# Patient Record
Sex: Male | Born: 1940 | Race: White | Hispanic: No | Marital: Married | Smoking: Former smoker
Health system: Southern US, Community
[De-identification: ages and names within clinical notes are randomized; demographics above are authoritative.]

## PROBLEM LIST (undated history)

## (undated) DIAGNOSIS — I4892 Unspecified atrial flutter: Secondary | ICD-10-CM

## (undated) DIAGNOSIS — E785 Hyperlipidemia, unspecified: Secondary | ICD-10-CM

## (undated) DIAGNOSIS — M1990 Unspecified osteoarthritis, unspecified site: Secondary | ICD-10-CM

## (undated) DIAGNOSIS — L409 Psoriasis, unspecified: Secondary | ICD-10-CM

## (undated) DIAGNOSIS — E119 Type 2 diabetes mellitus without complications: Secondary | ICD-10-CM

## (undated) DIAGNOSIS — I499 Cardiac arrhythmia, unspecified: Secondary | ICD-10-CM

## (undated) DIAGNOSIS — K4090 Unilateral inguinal hernia, without obstruction or gangrene, not specified as recurrent: Secondary | ICD-10-CM

## (undated) HISTORY — DX: Hyperlipidemia, unspecified: E78.5

## (undated) HISTORY — DX: Type 2 diabetes mellitus without complications: E11.9

## (undated) HISTORY — DX: Unspecified osteoarthritis, unspecified site: M19.90

## (undated) HISTORY — PX: APPENDECTOMY: SHX54

## (undated) HISTORY — PX: HERNIA REPAIR: SHX51

## (undated) HISTORY — DX: Unspecified atrial flutter: I48.92

---

## 2003-07-12 ENCOUNTER — Ambulatory Visit (HOSPITAL_COMMUNITY): Admission: RE | Admit: 2003-07-12 | Discharge: 2003-07-12 | Payer: Self-pay | Admitting: Internal Medicine

## 2004-01-10 ENCOUNTER — Inpatient Hospital Stay (HOSPITAL_COMMUNITY): Admission: RE | Admit: 2004-01-10 | Discharge: 2004-01-11 | Payer: Self-pay | Admitting: Internal Medicine

## 2005-10-22 HISTORY — PX: ABLATION: SHX5711

## 2006-02-06 ENCOUNTER — Ambulatory Visit: Payer: Self-pay | Admitting: Internal Medicine

## 2006-02-06 ENCOUNTER — Ambulatory Visit (HOSPITAL_COMMUNITY): Admission: RE | Admit: 2006-02-06 | Discharge: 2006-02-06 | Payer: Self-pay | Admitting: Internal Medicine

## 2006-02-12 ENCOUNTER — Ambulatory Visit (HOSPITAL_COMMUNITY): Admission: RE | Admit: 2006-02-12 | Discharge: 2006-02-12 | Payer: Self-pay | Admitting: Internal Medicine

## 2006-02-28 ENCOUNTER — Emergency Department (HOSPITAL_COMMUNITY): Admission: EM | Admit: 2006-02-28 | Discharge: 2006-02-28 | Payer: Self-pay | Admitting: Emergency Medicine

## 2006-03-28 ENCOUNTER — Inpatient Hospital Stay (HOSPITAL_COMMUNITY): Admission: AD | Admit: 2006-03-28 | Discharge: 2006-03-29 | Payer: Self-pay | Admitting: Internal Medicine

## 2006-03-29 ENCOUNTER — Ambulatory Visit: Payer: Self-pay | Admitting: *Deleted

## 2006-04-02 ENCOUNTER — Encounter (HOSPITAL_COMMUNITY): Admission: RE | Admit: 2006-04-02 | Discharge: 2006-05-02 | Payer: Self-pay | Admitting: *Deleted

## 2006-04-02 ENCOUNTER — Ambulatory Visit: Payer: Self-pay | Admitting: *Deleted

## 2006-04-04 ENCOUNTER — Ambulatory Visit: Payer: Self-pay | Admitting: *Deleted

## 2006-04-11 ENCOUNTER — Ambulatory Visit: Payer: Self-pay | Admitting: *Deleted

## 2006-04-11 ENCOUNTER — Inpatient Hospital Stay (HOSPITAL_BASED_OUTPATIENT_CLINIC_OR_DEPARTMENT_OTHER): Admission: RE | Admit: 2006-04-11 | Discharge: 2006-04-11 | Payer: Self-pay | Admitting: Internal Medicine

## 2006-04-15 ENCOUNTER — Ambulatory Visit: Payer: Self-pay | Admitting: *Deleted

## 2006-04-25 ENCOUNTER — Ambulatory Visit: Payer: Self-pay | Admitting: Cardiology

## 2006-04-30 ENCOUNTER — Ambulatory Visit: Payer: Self-pay | Admitting: Cardiology

## 2006-05-02 ENCOUNTER — Ambulatory Visit: Payer: Self-pay | Admitting: Internal Medicine

## 2006-05-02 ENCOUNTER — Inpatient Hospital Stay (HOSPITAL_COMMUNITY): Admission: RE | Admit: 2006-05-02 | Discharge: 2006-05-03 | Payer: Self-pay | Admitting: Internal Medicine

## 2006-05-06 ENCOUNTER — Ambulatory Visit: Payer: Self-pay | Admitting: Cardiology

## 2006-05-15 ENCOUNTER — Ambulatory Visit: Payer: Self-pay | Admitting: *Deleted

## 2006-05-15 ENCOUNTER — Encounter (INDEPENDENT_AMBULATORY_CARE_PROVIDER_SITE_OTHER): Payer: Self-pay | Admitting: *Deleted

## 2006-05-28 ENCOUNTER — Ambulatory Visit: Payer: Self-pay | Admitting: *Deleted

## 2006-06-13 ENCOUNTER — Ambulatory Visit: Payer: Self-pay | Admitting: Internal Medicine

## 2006-06-13 ENCOUNTER — Ambulatory Visit: Payer: Self-pay | Admitting: Cardiology

## 2012-05-22 DEATH — deceased

## 2013-05-21 ENCOUNTER — Encounter (INDEPENDENT_AMBULATORY_CARE_PROVIDER_SITE_OTHER): Payer: Self-pay | Admitting: *Deleted

## 2013-12-24 ENCOUNTER — Telehealth (INDEPENDENT_AMBULATORY_CARE_PROVIDER_SITE_OTHER): Payer: Self-pay | Admitting: *Deleted

## 2013-12-24 ENCOUNTER — Encounter (INDEPENDENT_AMBULATORY_CARE_PROVIDER_SITE_OTHER): Payer: Self-pay

## 2013-12-24 ENCOUNTER — Other Ambulatory Visit (INDEPENDENT_AMBULATORY_CARE_PROVIDER_SITE_OTHER): Payer: Self-pay | Admitting: *Deleted

## 2013-12-24 DIAGNOSIS — Z1211 Encounter for screening for malignant neoplasm of colon: Secondary | ICD-10-CM

## 2013-12-24 NOTE — Telephone Encounter (Signed)
Patient needs movi prep 

## 2013-12-25 MED ORDER — PEG-KCL-NACL-NASULF-NA ASC-C 100 G PO SOLR: 1.0000 | Freq: Once | ORAL | Status: DC

## 2014-01-04 ENCOUNTER — Encounter (INDEPENDENT_AMBULATORY_CARE_PROVIDER_SITE_OTHER): Payer: Self-pay | Admitting: *Deleted

## 2014-01-19 ENCOUNTER — Telehealth (INDEPENDENT_AMBULATORY_CARE_PROVIDER_SITE_OTHER): Payer: Self-pay | Admitting: *Deleted

## 2014-01-19 NOTE — Telephone Encounter (Signed)
agree

## 2014-01-19 NOTE — Telephone Encounter (Signed)
  Procedure: tcs  Reason/Indication:  screening  Has patient had this procedure before?  yes, 2004 -- scanned  If so, when, by whom and where?    Is there a family history of colon cancer?  no  Who?  What age when diagnosed?    Is patient diabetic?   no      Does patient have prosthetic heart valve?  no  Do you have a pacemaker?  no  Has patient ever had endocarditis? no  Has patient had joint replacement within last 12 months?  no  Does patient tend to be constipated or take laxatives? no  Is patient on Coumadin, Plavix and/or Aspirin? no  Medications: humira injection every 2 weeks  Allergies: nkda  Medication Adjustment:   Procedure date & time: 02/17/14

## 2014-02-04 ENCOUNTER — Encounter (HOSPITAL_COMMUNITY): Payer: Self-pay | Admitting: Pharmacy Technician

## 2014-02-17 ENCOUNTER — Encounter (HOSPITAL_COMMUNITY): Payer: Self-pay | Admitting: *Deleted

## 2014-02-17 ENCOUNTER — Ambulatory Visit (HOSPITAL_COMMUNITY)
Admission: RE | Admit: 2014-02-17 | Discharge: 2014-02-17 | Disposition: A | Discharge status: Home / Self Care | Payer: Medicare PPO | Source: Ambulatory Visit | Attending: Internal Medicine | Admitting: Internal Medicine

## 2014-02-17 ENCOUNTER — Encounter (HOSPITAL_COMMUNITY)
Admission: RE | Disposition: A | Discharge status: Home / Self Care | Payer: Self-pay | Source: Ambulatory Visit | Attending: Internal Medicine

## 2014-02-17 DIAGNOSIS — Q438 Other specified congenital malformations of intestine: Secondary | ICD-10-CM | POA: Insufficient documentation

## 2014-02-17 DIAGNOSIS — K644 Residual hemorrhoidal skin tags: Secondary | ICD-10-CM | POA: Insufficient documentation

## 2014-02-17 DIAGNOSIS — K6389 Other specified diseases of intestine: Secondary | ICD-10-CM | POA: Insufficient documentation

## 2014-02-17 DIAGNOSIS — Z79899 Other long term (current) drug therapy: Secondary | ICD-10-CM | POA: Insufficient documentation

## 2014-02-17 DIAGNOSIS — Z1211 Encounter for screening for malignant neoplasm of colon: Secondary | ICD-10-CM | POA: Insufficient documentation

## 2014-02-17 DIAGNOSIS — K5730 Diverticulosis of large intestine without perforation or abscess without bleeding: Secondary | ICD-10-CM | POA: Insufficient documentation

## 2014-02-17 DIAGNOSIS — R197 Diarrhea, unspecified: Secondary | ICD-10-CM

## 2014-02-17 HISTORY — DX: Psoriasis, unspecified: L40.9

## 2014-02-17 HISTORY — PX: COLONOSCOPY: SHX5424

## 2014-02-17 SURGERY — COLONOSCOPY
Anesthesia: Moderate Sedation

## 2014-02-17 MED ORDER — MEPERIDINE HCL 50 MG/ML IJ SOLN
INTRAMUSCULAR | Status: DC | PRN
  Administered 2014-02-17 (×2): 25 mg via INTRAVENOUS

## 2014-02-17 MED ORDER — MIDAZOLAM HCL 5 MG/5ML IJ SOLN
INTRAMUSCULAR | Status: AC
  Filled 2014-02-17: qty 10

## 2014-02-17 MED ORDER — MIDAZOLAM HCL 5 MG/5ML IJ SOLN
INTRAMUSCULAR | Status: DC | PRN
  Administered 2014-02-17 (×2): 2 mg via INTRAVENOUS
  Administered 2014-02-17: 1 mg via INTRAVENOUS

## 2014-02-17 MED ORDER — SODIUM CHLORIDE 0.9 % IV SOLN
INTRAVENOUS | Status: DC
  Administered 2014-02-17: 1000 mL via INTRAVENOUS

## 2014-02-17 MED ORDER — SIMETHICONE 40 MG/0.6ML PO SUSP
ORAL | Status: DC | PRN
  Administered 2014-02-17: 12:00:00

## 2014-02-17 MED ORDER — MEPERIDINE HCL 50 MG/ML IJ SOLN
INTRAMUSCULAR | Status: AC
  Filled 2014-02-17: qty 1

## 2014-02-17 NOTE — Op Note (Signed)
COLONOSCOPY PROCEDURE REPORT  PATIENT:  Roger KyleRichard M Schrieber  MR#:  [id number] Birthdate:  1940/12/22, 73 y.o., male Endoscopist:  Dr. Malissa HippoNajeeb U. Rehman, MD Referred By:  Dr. Carylon Perchesoy Fagan, MD  Procedure Date: 02/17/2014  Procedure:   Colonoscopy  Indications:  Patient is 73 year old Caucasian male who is here for screening colonoscopy. His last exam was in 2004. He states he has chronic diarrhea but 1-2 loose stools a day. Family history is negative for CRC or inflammatory bowel disease. He is on Humira for psoriasis.  Informed Consent:  The procedure and risks were reviewed with the patient and informed consent was obtained.  Medications:  Demerol 50 mg IV Versed 5 mg IV  Description of procedure:  After a digital rectal exam was performed, that colonoscope was advanced from the anus through the rectum and colon to the area of the cecum, ileocecal valve and appendiceal orifice. The cecum was deeply intubated. These structures were well-seen and photographed for the record. From the level of the cecum and ileocecal valve, the scope was slowly and cautiously withdrawn. The mucosal surfaces were carefully surveyed utilizing scope tip to flexion to facilitate fold flattening as needed. The scope was pulled down into the rectum where a thorough exam including retroflexion was performed.  Findings:   Prep excellent. Redundant colon with normal appearing mucosa throughout. Scattered diverticula noted at descending and sigmoid colon. Normal rectal mucosa. Small hemorrhoids below the dentate line.   Therapeutic/Diagnostic Maneuvers Performed:  Random biopsies taken for mucosa of sigmoid colon looking for microscopic colitis.  Complications:  None  Cecal Withdrawal Time:  7 minutes  Impression:  Examination performed to cecum. Redundant colon with left-sided diverticulosis. Random biopsies taken from mucosa of sigmoid colon looking for microscopic colitis. Small external  hemorrhoids.  Recommendations:  Standard instructions given. I will contact patient with biopsy results and further recommendations.  Malissa Hippoajeeb U Rehman  02/17/2014 12:42 PM  CC: Dr. Carylon PerchesFAGAN,ROY, MD & Dr. Bonnetta BarryNo ref. provider found

## 2014-02-17 NOTE — H&P (Signed)
Roger George is an 73 y.o. male.   Chief Complaint: Patient is here for colonoscopy. HPI: Patient is 73 year old Caucasian male who is here for screening colonoscopy. His last exam was in 2004. He denies abdominal pain or rectal bleeding. He always has loose stools usually 1-2 per day. 35 years ago he was told by his physician that he had parasite and he would have lifelong diarrhea. He has good appetite and his weight has been stable. He is on Humira for psoriasis. Eminase is negative for CRC or inflammatory bowel disease.  Past Medical History  Diagnosis Date  . Psoriasis     Past Surgical History  Procedure Laterality Date  . Ablation      heart  . Appendectomy    . Hernia repair Right     History reviewed. No pertinent family history. Social History:  reports that he has never smoked. He does not have any smokeless tobacco history on file. His alcohol and drug histories are not on file.  Allergies: No Known Allergies  Medications Prior to Admission  Medication Sig Dispense Refill  . Adalimumab (HUMIRA) 20 MG/0.4ML KIT Inject 20 mg into the skin every 14 (fourteen) days.      . peg 3350 powder (MOVIPREP) 100 G SOLR Take 1 kit (200 g total) by mouth once.  1 kit  0    No results found for this or any previous visit (from the past 48 hour(s)). No results found.  ROS  Blood pressure 123/91, pulse 75, temperature 97.7 F (36.5 C), temperature source Oral, resp. rate 19, height 5' 11"  (1.803 m), weight 227 lb (102.967 kg), SpO2 92.00%. Physical Exam  Constitutional: He appears well-developed and well-nourished.  HENT:  Mouth/Throat: Oropharynx is clear and moist.  Eyes: Conjunctivae are normal. No scleral icterus.  Neck: No thyromegaly present.  Cardiovascular: Normal rate, regular rhythm and normal heart sounds.   No murmur heard. Respiratory: Effort normal and breath sounds normal.  GI: Soft. He exhibits no distension and no mass. There is no tenderness.   Musculoskeletal: He exhibits no edema.  Lymphadenopathy:    He has no cervical adenopathy.  Neurological: He is alert.  Skin: Skin is warm and dry.  Erythematous rash at left leg     Assessment/Plan Average risk screening colonoscopy. Chronic diarrhea.  Shane Badeaux U Jermya Dowding 02/17/2014, 12:07 PM

## 2014-02-17 NOTE — Discharge Instructions (Signed)
Resume usual medications and high fiber diet. °No driving for 24 hours. °Physician will call with biopsy results ° ° °High-Fiber Diet °Fiber is found in fruits, vegetables, and grains. A high-fiber diet encourages the addition of more whole grains, legumes, fruits, and vegetables in your diet. The recommended amount of fiber for adult males is 38 g per day. For adult females, it is 25 g per day. Pregnant and lactating women should get 28 g of fiber per day. If you have a digestive or bowel problem, ask your caregiver for advice before adding high-fiber foods to your diet. Eat a variety of high-fiber foods instead of only a select few type of foods.  °PURPOSE °· To increase stool bulk. °· To make bowel movements more regular to prevent constipation. °· To lower cholesterol. °· To prevent overeating. °WHEN IS THIS DIET USED? °· It may be used if you have constipation and hemorrhoids. °· It may be used if you have uncomplicated diverticulosis (intestine condition) and irritable bowel syndrome. °· It may be used if you need help with weight management. °· It may be used if you want to add it to your diet as a protective measure against atherosclerosis, diabetes, and cancer. °SOURCES OF FIBER °· Whole-grain breads and cereals. °· Fruits, such as apples, oranges, bananas, berries, prunes, and pears. °· Vegetables, such as green peas, carrots, sweet potatoes, beets, broccoli, cabbage, spinach, and artichokes. °· Legumes, such split peas, soy, lentils. °· Almonds. °FIBER CONTENT IN FOODS °Starches and Grains / Dietary Fiber (g) °· Cheerios, 1 cup / 3 g °· Corn Flakes cereal, 1 cup / 0.7 g °· Rice crispy treat cereal, 1¼ cup / 0.3 g °· Instant oatmeal (cooked), ½ cup / 2 g °· Frosted wheat cereal, 1 cup / 5.1 g °· Brown, long-grain rice (cooked), 1 cup / 3.5 g °· White, long-grain rice (cooked), 1 cup / 0.6 g °· Enriched macaroni (cooked), 1 cup / 2.5 g °Legumes / Dietary Fiber (g) °· Baked beans (canned, plain, or  vegetarian), ½ cup / 5.2 g °· Kidney beans (canned), ½ cup / 6.8 g °· Pinto beans (cooked), ½ cup / 5.5 g °Breads and Crackers / Dietary Fiber (g) °· Plain or honey graham crackers, 2 squares / 0.7 g °· Saltine crackers, 3 squares / 0.3 g °· Plain, salted pretzels, 10 pieces / 1.8 g °· Whole-wheat bread, 1 slice / 1.9 g °· White bread, 1 slice / 0.7 g °· Raisin bread, 1 slice / 1.2 g °· Plain bagel, 3 oz / 2 g °· Flour tortilla, 1 oz / 0.9 g °· Corn tortilla, 1 small / 1.5 g °· Hamburger or hotdog bun, 1 small / 0.9 g °Fruits / Dietary Fiber (g) °· Apple with skin, 1 medium / 4.4 g °· Sweetened applesauce, ½ cup / 1.5 g °· Banana, ½ medium / 1.5 g °· Grapes, 10 grapes / 0.4 g °· Orange, 1 small / 2.3 g °· Raisin, 1.5 oz / 1.6 g °· Melon, 1 cup / 1.4 g °Vegetables / Dietary Fiber (g) °· Green beans (canned), ½ cup / 1.3 g °· Carrots (cooked), ½ cup / 2.3 g °· Broccoli (cooked), ½ cup / 2.8 g °· Peas (cooked), ½ cup / 4.4 g °· Mashed potatoes, ½ cup / 1.6 g °· Lettuce, 1 cup / 0.5 g °· Corn (canned), ½ cup / 1.6 g °· Tomato, ½ cup / 1.1 g °Document Released: 10/08/2005 Document Revised: 04/08/2012 Document Reviewed: 01/10/2012 °ExitCare® Patient   Information 2014 [employer] [place]. Colonoscopy Care After These instructions give you information on caring for yourself after your procedure. Your doctor may also give you more specific instructions. Call your doctor if you have any problems or questions after your procedure. HOME CARE  Take it easy for the next 24 hours.  Rest.  Walk or use warm packs on your belly (abdomen) if you have belly cramping or gas.  Do not drive for 24 hours.  You may shower.  Do not sign important papers or use machinery for 24 hours.  Drink enough fluids to keep your pee (urine) clear or pale yellow.  Resume your normal diet. Avoid heavy or fried foods.  Avoid alcohol.  Continue taking your normal medicines.  Only take medicine as told by your doctor. Do not take  aspirin. If you had growths (polyps) removed:  Do not take aspirin.  Do not drink alcohol for 7 days or as told by your doctor.  Eat a soft diet for 24 hours. GET HELP RIGHT AWAY IF:  You have a fever.  You pass clumps of tissue (blood clots) or fill the toilet with blood.  You have belly pain that gets worse and medicine does not help.  Your belly is puffy (swollen).  You feel sick to your stomach (nauseous) or throw up (vomit). MAKE SURE YOU:  Understand these instructions.  Will watch your condition.  Will get help right away if you are not doing well or get worse. Document Released: 11/10/2010 Document Revised: 12/31/2011 Document Reviewed: 06/15/2013 [facility] Patient Information 2014 [employer], [place].

## 2014-02-19 ENCOUNTER — Encounter (HOSPITAL_COMMUNITY): Payer: Self-pay | Admitting: Internal Medicine

## 2014-02-23 ENCOUNTER — Encounter (INDEPENDENT_AMBULATORY_CARE_PROVIDER_SITE_OTHER): Payer: Self-pay | Admitting: *Deleted

## 2014-10-22 DIAGNOSIS — K4090 Unilateral inguinal hernia, without obstruction or gangrene, not specified as recurrent: Secondary | ICD-10-CM

## 2014-10-22 HISTORY — DX: Unilateral inguinal hernia, without obstruction or gangrene, not specified as recurrent: K40.90

## 2014-12-29 DIAGNOSIS — M71071 Abscess of bursa, right ankle and foot: Secondary | ICD-10-CM | POA: Diagnosis not present

## 2014-12-31 DIAGNOSIS — M71071 Abscess of bursa, right ankle and foot: Secondary | ICD-10-CM | POA: Diagnosis not present

## 2015-01-03 DIAGNOSIS — M71071 Abscess of bursa, right ankle and foot: Secondary | ICD-10-CM | POA: Diagnosis not present

## 2015-02-28 ENCOUNTER — Encounter (HOSPITAL_BASED_OUTPATIENT_CLINIC_OR_DEPARTMENT_OTHER): Payer: Medicare PPO | Attending: Plastic Surgery

## 2015-02-28 DIAGNOSIS — Z8249 Family history of ischemic heart disease and other diseases of the circulatory system: Secondary | ICD-10-CM | POA: Diagnosis not present

## 2015-02-28 DIAGNOSIS — Z87891 Personal history of nicotine dependence: Secondary | ICD-10-CM | POA: Insufficient documentation

## 2015-02-28 DIAGNOSIS — L97811 Non-pressure chronic ulcer of other part of right lower leg limited to breakdown of skin: Secondary | ICD-10-CM | POA: Insufficient documentation

## 2015-02-28 DIAGNOSIS — L97812 Non-pressure chronic ulcer of other part of right lower leg with fat layer exposed: Secondary | ICD-10-CM | POA: Diagnosis not present

## 2015-02-28 DIAGNOSIS — K4090 Unilateral inguinal hernia, without obstruction or gangrene, not specified as recurrent: Secondary | ICD-10-CM | POA: Diagnosis not present

## 2015-02-28 DIAGNOSIS — M1990 Unspecified osteoarthritis, unspecified site: Secondary | ICD-10-CM | POA: Insufficient documentation

## 2015-02-28 DIAGNOSIS — Z833 Family history of diabetes mellitus: Secondary | ICD-10-CM | POA: Diagnosis not present

## 2015-03-14 DIAGNOSIS — Z8249 Family history of ischemic heart disease and other diseases of the circulatory system: Secondary | ICD-10-CM | POA: Diagnosis not present

## 2015-03-14 DIAGNOSIS — K4090 Unilateral inguinal hernia, without obstruction or gangrene, not specified as recurrent: Secondary | ICD-10-CM | POA: Diagnosis not present

## 2015-03-14 DIAGNOSIS — L97812 Non-pressure chronic ulcer of other part of right lower leg with fat layer exposed: Secondary | ICD-10-CM | POA: Diagnosis not present

## 2015-03-14 DIAGNOSIS — Z833 Family history of diabetes mellitus: Secondary | ICD-10-CM | POA: Diagnosis not present

## 2015-03-14 DIAGNOSIS — M1990 Unspecified osteoarthritis, unspecified site: Secondary | ICD-10-CM | POA: Diagnosis not present

## 2015-03-14 DIAGNOSIS — L97811 Non-pressure chronic ulcer of other part of right lower leg limited to breakdown of skin: Secondary | ICD-10-CM | POA: Diagnosis not present

## 2015-03-14 DIAGNOSIS — Z87891 Personal history of nicotine dependence: Secondary | ICD-10-CM | POA: Diagnosis not present

## 2015-03-28 ENCOUNTER — Encounter (HOSPITAL_BASED_OUTPATIENT_CLINIC_OR_DEPARTMENT_OTHER): Payer: Medicare PPO | Attending: Plastic Surgery

## 2015-03-28 DIAGNOSIS — L97811 Non-pressure chronic ulcer of other part of right lower leg limited to breakdown of skin: Secondary | ICD-10-CM | POA: Diagnosis not present

## 2015-03-29 ENCOUNTER — Other Ambulatory Visit: Payer: Self-pay | Admitting: General Surgery

## 2015-03-29 DIAGNOSIS — S81809A Unspecified open wound, unspecified lower leg, initial encounter: Secondary | ICD-10-CM | POA: Diagnosis not present

## 2015-04-11 DIAGNOSIS — L97811 Non-pressure chronic ulcer of other part of right lower leg limited to breakdown of skin: Secondary | ICD-10-CM | POA: Diagnosis not present

## 2015-04-12 DIAGNOSIS — L409 Psoriasis, unspecified: Secondary | ICD-10-CM | POA: Diagnosis not present

## 2015-04-18 DIAGNOSIS — L97811 Non-pressure chronic ulcer of other part of right lower leg limited to breakdown of skin: Secondary | ICD-10-CM | POA: Diagnosis not present

## 2015-04-18 DIAGNOSIS — S81809A Unspecified open wound, unspecified lower leg, initial encounter: Secondary | ICD-10-CM | POA: Diagnosis not present

## 2015-04-18 NOTE — Pre-Procedure Instructions (Signed)
    Roger George  04/18/2015     Your procedure is scheduled on  Wednesday, July 6   [facility]                Your surgery is scheduled for 8:30 am                  Call this number if you have problems the morning of surgery: [phone]              For any other questions, please call [phone], Monday - Friday 8 AM - 4 PM.     Remember:  Do not eat food or drink liquids after midnight Tuesday, July 5  Take these medicines the morning of surgery with A SIP OF WATER -   Do not wear jewelry, make-up or nail polish.  Do not wear lotions, powders, or perfumes.               Men may shave face and neck.  Do not bring valuables to the hospital.  [facility] for any belongings or valuables.  Contacts, dentures or bridgework may not be worn into surgery.  Leave your suitcase in the car.  After surgery it may be brought to your room.  For patients admitted to the hospital, discharge time will be determined by your treatment team.  Patients discharged the day of surgery will not be allowed to drive home.   Name and phone number of your driver:   -  Special instructions:  Review  [facility] - Preparing For Surgery.  Please read over the following fact sheets that you were given. Pain Booklet, Coughing and Deep Breathing and Surgical Site Infection Prevention

## 2015-04-19 ENCOUNTER — Encounter (HOSPITAL_COMMUNITY): Payer: Self-pay

## 2015-04-19 ENCOUNTER — Encounter (HOSPITAL_COMMUNITY)
Admission: RE | Admit: 2015-04-19 | Discharge: 2015-04-19 | Disposition: A | Discharge status: Home / Self Care | Payer: Medicare PPO | Source: Ambulatory Visit | Attending: General Surgery | Admitting: General Surgery

## 2015-04-19 DIAGNOSIS — Z01812 Encounter for preprocedural laboratory examination: Secondary | ICD-10-CM | POA: Insufficient documentation

## 2015-04-19 DIAGNOSIS — I491 Atrial premature depolarization: Secondary | ICD-10-CM | POA: Insufficient documentation

## 2015-04-19 DIAGNOSIS — K4090 Unilateral inguinal hernia, without obstruction or gangrene, not specified as recurrent: Secondary | ICD-10-CM | POA: Insufficient documentation

## 2015-04-19 HISTORY — DX: Unilateral inguinal hernia, without obstruction or gangrene, not specified as recurrent: K40.90

## 2015-04-19 HISTORY — DX: Cardiac arrhythmia, unspecified: I49.9

## 2015-04-19 LAB — CBC WITH DIFFERENTIAL/PLATELET
Basophils Absolute: 0 10*3/uL (ref 0.0–0.1)
Basophils Relative: 1 % (ref 0–1)
Eosinophils Absolute: 0.2 10*3/uL (ref 0.0–0.7)
Eosinophils Relative: 3 % (ref 0–5)
HEMATOCRIT: 42.2 % (ref 39.0–52.0)
Hemoglobin: 14.4 g/dL (ref 13.0–17.0)
LYMPHS ABS: 1.6 10*3/uL (ref 0.7–4.0)
Lymphocytes Relative: 28 % (ref 12–46)
MCH: 30.6 pg (ref 26.0–34.0)
MCHC: 34.1 g/dL (ref 30.0–36.0)
MCV: 89.8 fL (ref 78.0–100.0)
Monocytes Absolute: 0.7 10*3/uL (ref 0.1–1.0)
Monocytes Relative: 13 % — ABNORMAL HIGH (ref 3–12)
Neutro Abs: 3.2 10*3/uL (ref 1.7–7.7)
Neutrophils Relative %: 55 % (ref 43–77)
Platelets: 155 10*3/uL (ref 150–400)
RBC: 4.7 MIL/uL (ref 4.22–5.81)
RDW: 13.7 % (ref 11.5–15.5)
WBC: 5.7 10*3/uL (ref 4.0–10.5)

## 2015-04-19 LAB — URINALYSIS, ROUTINE W REFLEX MICROSCOPIC
Bilirubin Urine: NEGATIVE
Glucose, UA: NEGATIVE mg/dL
Hgb urine dipstick: NEGATIVE
KETONES UR: NEGATIVE mg/dL
LEUKOCYTES UA: NEGATIVE
NITRITE: NEGATIVE
PROTEIN: NEGATIVE mg/dL
Specific Gravity, Urine: 1.016 (ref 1.005–1.030)
Urobilinogen, UA: 0.2 mg/dL (ref 0.0–1.0)
pH: 5 (ref 5.0–8.0)

## 2015-04-19 LAB — BASIC METABOLIC PANEL
Anion gap: 7 (ref 5–15)
BUN: 12 mg/dL (ref 6–20)
CALCIUM: 9.3 mg/dL (ref 8.9–10.3)
CO2: 27 mmol/L (ref 22–32)
CREATININE: 0.95 mg/dL (ref 0.61–1.24)
Chloride: 104 mmol/L (ref 101–111)
GFR calc Af Amer: >60 mL/min (ref >60)
Glucose, Bld: 151 mg/dL — ABNORMAL HIGH (ref 65–99)
POTASSIUM: 3.9 mmol/L (ref 3.5–5.1)
Sodium: 138 mmol/L (ref 135–145)

## 2015-04-19 NOTE — Progress Notes (Signed)
   04/19/15 0905  OBSTRUCTIVE SLEEP APNEA  Have you ever been diagnosed with sleep apnea through a sleep study? No  Do you snore loudly (loud enough to be heard through closed doors)?  0  Do you often feel tired, fatigued, or sleepy during the daytime? 0  Has anyone observed you stop breathing during your sleep? 0  Do you have, or are you being treated for high blood pressure? 0  BMI more than 35 kg/m2? 0  Age over 75 years old? 1  Neck circumference greater than 40 cm/16 inches? 1 (18)  Gender: 1

## 2015-04-27 ENCOUNTER — Ambulatory Visit (HOSPITAL_COMMUNITY)
Admission: RE | Admit: 2015-04-27 | Discharge: 2015-04-27 | Disposition: A | Discharge status: Home / Self Care | Payer: Medicare PPO | Source: Ambulatory Visit | Attending: General Surgery | Admitting: General Surgery

## 2015-04-27 ENCOUNTER — Ambulatory Visit (HOSPITAL_COMMUNITY): Payer: Medicare PPO | Admitting: Anesthesiology

## 2015-04-27 ENCOUNTER — Encounter (HOSPITAL_COMMUNITY)
Admission: RE | Disposition: A | Discharge status: Home / Self Care | Payer: Medicare PPO | Source: Ambulatory Visit | Attending: General Surgery

## 2015-04-27 ENCOUNTER — Encounter (HOSPITAL_COMMUNITY): Payer: Self-pay | Admitting: Anesthesiology

## 2015-04-27 DIAGNOSIS — Z87891 Personal history of nicotine dependence: Secondary | ICD-10-CM | POA: Diagnosis not present

## 2015-04-27 DIAGNOSIS — K4090 Unilateral inguinal hernia, without obstruction or gangrene, not specified as recurrent: Secondary | ICD-10-CM | POA: Diagnosis not present

## 2015-04-27 DIAGNOSIS — Z6833 Body mass index (BMI) 33.0-33.9, adult: Secondary | ICD-10-CM | POA: Insufficient documentation

## 2015-04-27 DIAGNOSIS — I4891 Unspecified atrial fibrillation: Secondary | ICD-10-CM | POA: Insufficient documentation

## 2015-04-27 DIAGNOSIS — K4030 Unilateral inguinal hernia, with obstruction, without gangrene, not specified as recurrent: Secondary | ICD-10-CM | POA: Diagnosis not present

## 2015-04-27 DIAGNOSIS — M1990 Unspecified osteoarthritis, unspecified site: Secondary | ICD-10-CM | POA: Diagnosis not present

## 2015-04-27 HISTORY — PX: INGUINAL HERNIA REPAIR: SHX194

## 2015-04-27 HISTORY — PX: INSERTION OF MESH: SHX5868

## 2015-04-27 SURGERY — REPAIR, HERNIA, INGUINAL, ADULT
Anesthesia: General | Site: Groin | Laterality: Left

## 2015-04-27 MED ORDER — OXYCODONE-ACETAMINOPHEN 10-325 MG PO TABS: 1.0000 | ORAL_TABLET | Freq: Four times a day (QID) | ORAL | Status: AC | PRN

## 2015-04-27 MED ORDER — LIDOCAINE HCL (CARDIAC) 20 MG/ML IV SOLN
INTRAVENOUS | Status: DC | PRN
  Administered 2015-04-27: 60 mg via INTRAVENOUS

## 2015-04-27 MED ORDER — GLYCOPYRROLATE 0.2 MG/ML IJ SOLN
INTRAMUSCULAR | Status: AC
  Filled 2015-04-27: qty 3

## 2015-04-27 MED ORDER — OXYCODONE HCL 5 MG PO TABS: 5.0000 mg | ORAL_TABLET | ORAL | Status: DC | PRN

## 2015-04-27 MED ORDER — 0.9 % SODIUM CHLORIDE (POUR BTL) OPTIME
TOPICAL | Status: DC | PRN
  Administered 2015-04-27: 1000 mL

## 2015-04-27 MED ORDER — LIDOCAINE HCL 4 % MT SOLN
OROMUCOSAL | Status: DC | PRN
  Administered 2015-04-27: 4 mL via TOPICAL

## 2015-04-27 MED ORDER — HYDROMORPHONE HCL 1 MG/ML IJ SOLN: 0.5000 mg | INTRAMUSCULAR | Status: DC | PRN

## 2015-04-27 MED ORDER — BUPIVACAINE HCL (PF) 0.25 % IJ SOLN
INTRAMUSCULAR | Status: DC | PRN
  Administered 2015-04-27: 20 mL

## 2015-04-27 MED ORDER — PROPOFOL 10 MG/ML IV BOLUS
INTRAVENOUS | Status: AC
  Filled 2015-04-27: qty 20

## 2015-04-27 MED ORDER — ONDANSETRON HCL 4 MG/2ML IJ SOLN
INTRAMUSCULAR | Status: DC | PRN
  Administered 2015-04-27: 4 mg via INTRAVENOUS

## 2015-04-27 MED ORDER — ROCURONIUM BROMIDE 50 MG/5ML IV SOLN
INTRAVENOUS | Status: AC
Start: 2015-04-27 — End: 2015-04-27
  Filled 2015-04-27: qty 1

## 2015-04-27 MED ORDER — LIDOCAINE HCL (CARDIAC) 20 MG/ML IV SOLN
INTRAVENOUS | Status: AC
  Filled 2015-04-27: qty 5

## 2015-04-27 MED ORDER — EPHEDRINE SULFATE 50 MG/ML IJ SOLN
INTRAMUSCULAR | Status: DC | PRN
  Administered 2015-04-27: 15 mg via INTRAVENOUS
  Administered 2015-04-27: 10 mg via INTRAVENOUS

## 2015-04-27 MED ORDER — NEOSTIGMINE METHYLSULFATE 10 MG/10ML IV SOLN
INTRAVENOUS | Status: DC | PRN
  Administered 2015-04-27: 3 mg via INTRAVENOUS

## 2015-04-27 MED ORDER — GLYCOPYRROLATE 0.2 MG/ML IJ SOLN
INTRAMUSCULAR | Status: DC | PRN
  Administered 2015-04-27: 0.4 mg via INTRAVENOUS
  Administered 2015-04-27: 0.2 mg via INTRAVENOUS

## 2015-04-27 MED ORDER — FENTANYL CITRATE (PF) 100 MCG/2ML IJ SOLN
INTRAMUSCULAR | Status: DC | PRN
  Administered 2015-04-27: 50 ug via INTRAVENOUS
  Administered 2015-04-27: 100 ug via INTRAVENOUS
  Administered 2015-04-27: 50 ug via INTRAVENOUS

## 2015-04-27 MED ORDER — LACTATED RINGERS IV SOLN
INTRAVENOUS | Status: DC | PRN
  Administered 2015-04-27 (×2): via INTRAVENOUS

## 2015-04-27 MED ORDER — ONDANSETRON HCL 4 MG/2ML IJ SOLN
INTRAMUSCULAR | Status: AC
  Filled 2015-04-27: qty 2

## 2015-04-27 MED ORDER — ROCURONIUM BROMIDE 100 MG/10ML IV SOLN
INTRAVENOUS | Status: DC | PRN
  Administered 2015-04-27: 20 mg via INTRAVENOUS
  Administered 2015-04-27: 10 mg via INTRAVENOUS

## 2015-04-27 MED ORDER — CEFAZOLIN SODIUM-DEXTROSE 2-3 GM-% IV SOLR
INTRAVENOUS | Status: DC | PRN
  Administered 2015-04-27: 2 g via INTRAVENOUS

## 2015-04-27 MED ORDER — SUCCINYLCHOLINE CHLORIDE 20 MG/ML IJ SOLN
INTRAMUSCULAR | Status: DC | PRN
  Administered 2015-04-27: 110 mg via INTRAVENOUS

## 2015-04-27 MED ORDER — NEOSTIGMINE METHYLSULFATE 10 MG/10ML IV SOLN
INTRAVENOUS | Status: AC
  Filled 2015-04-27: qty 1

## 2015-04-27 MED ORDER — FENTANYL CITRATE (PF) 250 MCG/5ML IJ SOLN
INTRAMUSCULAR | Status: AC
  Filled 2015-04-27: qty 5

## 2015-04-27 MED ORDER — PROPOFOL 10 MG/ML IV BOLUS
INTRAVENOUS | Status: DC | PRN
  Administered 2015-04-27: 150 mg via INTRAVENOUS

## 2015-04-27 MED ORDER — BUPIVACAINE HCL (PF) 0.25 % IJ SOLN
INTRAMUSCULAR | Status: AC
Start: 2015-04-27 — End: 2015-04-27
  Filled 2015-04-27: qty 30

## 2015-04-27 MED ORDER — ONDANSETRON HCL 4 MG/2ML IJ SOLN: 4.0000 mg | Freq: Once | INTRAMUSCULAR | Status: DC | PRN

## 2015-04-27 SURGICAL SUPPLY — 51 items
BLADE SURG ROTATE 9660 (MISCELLANEOUS) IMPLANT
CANISTER SUCTION 2500CC (MISCELLANEOUS) IMPLANT
CHLORAPREP W/TINT 26ML (MISCELLANEOUS) ×3 IMPLANT
COVER SURGICAL LIGHT HANDLE (MISCELLANEOUS) ×3 IMPLANT
DECANTER SPIKE VIAL GLASS SM (MISCELLANEOUS) IMPLANT
DRAIN PENROSE 1/2X12 LTX STRL (WOUND CARE) ×3 IMPLANT
DRAPE LAPAROTOMY TRNSV 102X78 (DRAPE) ×3 IMPLANT
ELECT CAUTERY BLADE 6.4 (BLADE) ×3 IMPLANT
ELECT REM PT RETURN 9FT ADLT (ELECTROSURGICAL) ×3
ELECTRODE REM PT RTRN 9FT ADLT (ELECTROSURGICAL) ×1 IMPLANT
GAUZE SPONGE 4X4 16PLY XRAY LF (GAUZE/BANDAGES/DRESSINGS) ×3 IMPLANT
GLOVE BIO SURGEON STRL SZ 6.5 (GLOVE) ×2 IMPLANT
GLOVE BIO SURGEON STRL SZ7 (GLOVE) ×6 IMPLANT
GLOVE BIO SURGEONS STRL SZ 6.5 (GLOVE) ×1
GLOVE BIOGEL PI IND STRL 6.5 (GLOVE) ×1 IMPLANT
GLOVE BIOGEL PI IND STRL 7.0 (GLOVE) ×2 IMPLANT
GLOVE BIOGEL PI IND STRL 7.5 (GLOVE) ×1 IMPLANT
GLOVE BIOGEL PI INDICATOR 6.5 (GLOVE) ×2
GLOVE BIOGEL PI INDICATOR 7.0 (GLOVE) ×4
GLOVE BIOGEL PI INDICATOR 7.5 (GLOVE) ×2
GOWN STRL REUS W/ TWL LRG LVL3 (GOWN DISPOSABLE) ×2 IMPLANT
GOWN STRL REUS W/TWL LRG LVL3 (GOWN DISPOSABLE) ×4
KIT BASIN OR (CUSTOM PROCEDURE TRAY) ×3 IMPLANT
KIT ROOM TURNOVER OR (KITS) ×3 IMPLANT
LIQUID BAND (GAUZE/BANDAGES/DRESSINGS) ×3 IMPLANT
MESH HERNIA SYS ULTRAPRO LRG (Mesh General) ×3 IMPLANT
NEEDLE HYPO 25GX1X1/2 BEV (NEEDLE) ×3 IMPLANT
NS IRRIG 1000ML POUR BTL (IV SOLUTION) ×3 IMPLANT
PACK SURGICAL SETUP 50X90 (CUSTOM PROCEDURE TRAY) ×3 IMPLANT
PAD ARMBOARD 7.5X6 YLW CONV (MISCELLANEOUS) ×3 IMPLANT
PEN SKIN MARKING BROAD (MISCELLANEOUS) ×3 IMPLANT
PENCIL BUTTON HOLSTER BLD 10FT (ELECTRODE) ×3 IMPLANT
SPONGE LAP 18X18 X RAY DECT (DISPOSABLE) ×3 IMPLANT
STAPLER VISISTAT 35W (STAPLE) ×3 IMPLANT
SUT MNCRL AB 4-0 PS2 18 (SUTURE) ×3 IMPLANT
SUT VIC AB 0 CT1 18XCR BRD 8 (SUTURE) ×1 IMPLANT
SUT VIC AB 0 CT1 27 (SUTURE)
SUT VIC AB 0 CT1 27XBRD ANBCTR (SUTURE) IMPLANT
SUT VIC AB 0 CT1 8-18 (SUTURE) ×2
SUT VIC AB 2-0 CT1 27 (SUTURE) ×2
SUT VIC AB 2-0 CT1 TAPERPNT 27 (SUTURE) ×1 IMPLANT
SUT VIC AB 2-0 SH 18 (SUTURE) ×3 IMPLANT
SUT VIC AB 3-0 SH 27 (SUTURE) ×2
SUT VIC AB 3-0 SH 27XBRD (SUTURE) ×1 IMPLANT
SUT VICRYL AB 2 0 TIES (SUTURE) ×3 IMPLANT
SYR CONTROL 10ML LL (SYRINGE) ×3 IMPLANT
TOWEL OR 17X24 6PK STRL BLUE (TOWEL DISPOSABLE) ×3 IMPLANT
TOWEL OR 17X26 10 PK STRL BLUE (TOWEL DISPOSABLE) ×3 IMPLANT
TUBE CONNECTING 12'X1/4 (SUCTIONS)
TUBE CONNECTING 12X1/4 (SUCTIONS) IMPLANT
YANKAUER SUCT BULB TIP NO VENT (SUCTIONS) IMPLANT

## 2015-04-27 NOTE — Anesthesia Postprocedure Evaluation (Signed)
  Anesthesia Post-op Note  Patient: Roger KyleRichard M George  Procedure(s) Performed: Procedure(s): HERNIA REPAIR INGUINAL ADULT  (Left) INSERTION OF MESH (Left)  Patient Location: PACU  Anesthesia Type:General  Level of Consciousness: awake and alert   Airway and Oxygen Therapy: Patient Spontanous Breathing  Post-op Pain: Controlled  Post-op Assessment: Post-op Vital signs reviewed, Patient's Cardiovascular Status Stable and Respiratory Function Stable  Post-op Vital Signs: Reviewed  Filed Vitals:   04/27/15 1049  BP: 153/83  Pulse: 56  Temp:   Resp: 16    Complications: No apparent anesthesia complications

## 2015-04-27 NOTE — Transfer of Care (Signed)
Immediate Anesthesia Transfer of Care Note  Patient: Roger KyleRichard M George  Procedure(s) Performed: Procedure(s): HERNIA REPAIR INGUINAL ADULT  (Left) INSERTION OF MESH (Left)  Patient Location: PACU  Anesthesia Type:General  Level of Consciousness: awake, alert , oriented, patient cooperative and responds to stimulation  Airway & Oxygen Therapy: Patient Spontanous Breathing and Patient connected to face mask oxygen  Post-op Assessment: Report given to RN, Post -op Vital signs reviewed and stable, Patient moving all extremities and Patient moving all extremities X 4  Post vital signs: Reviewed and stable  Last Vitals:  Filed Vitals:   04/27/15 0643  BP: 134/81  Pulse: 55  Temp: 36.7 C  Resp: 20    Complications: No apparent anesthesia complications

## 2015-04-27 NOTE — Anesthesia Procedure Notes (Signed)
Procedure Name: Intubation Date/Time: 04/27/2015 8:34 AM Performed by: Gershon Mussel, Avon Mergenthaler Pre-anesthesia Checklist: Patient identified, Patient being monitored, Timeout performed, Emergency Drugs available and Suction available Patient Re-evaluated:Patient Re-evaluated prior to inductionOxygen Delivery Method: Circle System Utilized Preoxygenation: Pre-oxygenation with 100% oxygen Intubation Type: IV induction Ventilation: Mask ventilation without difficulty Laryngoscope Size: 3 and Miller Grade View: Grade I Tube type: Oral Tube size: 7.0 mm Number of attempts: 1 Airway Equipment and Method: Stylet and LTA kit utilized Placement Confirmation: ETT inserted through vocal cords under direct vision,  positive ETCO2 and breath sounds checked- equal and bilateral Secured at: 21 cm Tube secured with: Tape Dental Injury: Teeth and Oropharynx as per pre-operative assessment

## 2015-04-27 NOTE — Discharge Instructions (Signed)
[facility]   INGUINAL [facility]ERN[facility]PAIR: POST OP INSTRUCTIONS  Always review your discharge instruction sheet given to you by the facility where your surgery was performed. IF YOU HAVE DISABILITY OR FAMILY LEAVE FORMS, YOU MUST BRING THEM TO THE OFFICE FOR PROCESSING.   DO NOT GIVE THEM TO YOUR DOCTOR.  1. A  prescription for pain medication may be given to you upon discharge.  Take your pain medication as prescribed, if needed.  If narcotic pain medicine is not needed, then you may take acetaminophen (Tylenol), naprosyn (Alleve) or ibuprofen (Advil) as needed. 2. Take your usually prescribed medications unless otherwise directed. 3. If you need a refill on your pain medication, please contact your pharmacy.  They will contact our office to request authorization. Prescriptions will not be filled after 5 pm or on week-ends. 4. You should follow a light diet the first 24 hours after arrival home, such as soup and crackers, etc.  Be sure to include lots of fluids daily.  Resume your normal diet the day after surgery. 5. Most patients will experience some swelling and bruising around the umbilicus or in the groin and scrotum.  Ice packs and reclining will help.  Swelling and bruising can take several days to resolve.  6. It is common to experience some constipation if taking pain medication after surgery.  Increasing fluid intake and taking a stool softener (such as Colace) will usually help or prevent this problem from occurring.  A mild laxative (Milk of Magnesia or Miralax) should be taken according to package directions if there are no bowel movements after 48 hours. 7. Unless discharge instructions indicate otherwise, you may remove your bandages 48 hours after surgery, and you may shower at that time.  You may have steri-strips (small skin tapes) in place directly over the incision.  These strips should be left on the skin for 7-10 days and will come off on their own.  If your  surgeon used skin glue on the incision, you may shower in 24 hours.  The glue will flake off over the next 2-3 weeks.  Any sutures or staples will be removed at the office during your follow-up visit. 8. ACTIVITIES:  You may resume regular (light) daily activities beginning the next day--such as daily self-care, walking, climbing stairs--gradually increasing activities as tolerated.  You may have sexual intercourse when it is comfortable.  Refrain from any heavy lifting or straining until approved by your doctor. a. You may drive when you are no longer taking prescription pain medication, you can comfortably wear a seatbelt, and you can safely maneuver your car and apply brakes. b. RETURN TO WORK:  __________________________________________________________ 9. You should see your doctor in the office for a follow-up appointment approximately 2-3 weeks after your surgery.  Make sure that you call for this appointment within a day or two after you arrive home to insure a convenient appointment time. 10. OTHER INSTRUCTIONS:  __________________________________________________________________________________________________________________________________________________________________________________________  WHEN TO CALL YOUR DOCTOR: 1. Fever over 101.0 2. Inability to urinate 3. Nausea and/or vomiting 4. Extreme swelling or bruising 5. Continued bleeding from incision. 6. Increased pain, redness, or drainage from the incision  The clinic staff is available to answer your questions during regular business hours.  Please dont hesitate to call and ask to speak to one of the nurses for clinical concerns.  If you have a medical emergency, go to the nearest emergency room or call 911.  A surgeon from [facility] is  always on call at the hospital   [address], [address], [place], [place]  [address] ?  [address] [address], [place], [place]   [address] [phone] ? [phone] ? [phone] [phone]  [phone] Web site: [email or web address]

## 2015-04-27 NOTE — Anesthesia Preprocedure Evaluation (Addendum)
Anesthesia Evaluation  Patient identified by MRN, date of birth, ID band Patient awake    Reviewed: Allergy & Precautions, NPO status , Patient's Chart, lab work & pertinent test results  Airway Mallampati: II       Dental   Pulmonary    Pulmonary exam normal       Cardiovascular Normal cardiovascular exam+ dysrhythmias     Neuro/Psych    GI/Hepatic   Endo/Other  Morbid obesity  Renal/GU      Musculoskeletal   Abdominal   Peds  Hematology   Anesthesia Other Findings   Reproductive/Obstetrics                            Anesthesia Physical Anesthesia Plan  ASA: II  Anesthesia Plan: General   Post-op Pain Management:    Induction: Intravenous  Airway Management Planned: Oral ETT  Additional Equipment:   Intra-op Plan:   Post-operative Plan: Extubation in OR  Informed Consent: I have reviewed the patients History and Physical, chart, labs and discussed the procedure including the risks, benefits and alternatives for the proposed anesthesia with the patient or authorized representative who has indicated his/her understanding and acceptance.     Plan Discussed with: CRNA, Anesthesiologist and Surgeon  Anesthesia Plan Comments:        Anesthesia Quick Evaluation

## 2015-04-27 NOTE — Op Note (Signed)
Preoperative diagnosis: left inguinal hernia Postoperative diagnosis: incarcerated indirect LIH Procedure: LIH repair with ultrapro hernia system Surgeon: Dr Harden MoMatt Naziya Hegwood Anesthesia: general EBL: minimal Complications: none Drains: none Specimens: none Sponge and needle count correct Dispo to pacu stable  Indications: This is a [age] yom who presents with symptomatic LIH. We discussed open LIH repair with mesh.  Procedure: After informed consent was obtained the patient was taken to the operating room.  He was given cefazolin and scds were in place.  He was placed under general anesthesia without complication. His left groin was prepped and draped in the standard sterile surgical fashion.  A timeout was performed.  I made a left groin incision. He had a very large incarcerated indirect hernia making identification of structures difficult.  I was able to identify the external oblique and incised it.  I then encircled the cord with a penrose drain. I was able to reduce a very large indirect hernia leaving the cord structures intact.  The floor was intact. I then placed the bottom portion of the ultrapro hernia system in the preperitoneal space and laid this flat.  I then opened the top portion of the bilayer and laid this flat.  I made a T cut in the mesh and wrapped this around the cord.  I then sutured the mesh with 2-0 vicryl several places to the pubic tubercle. I then tacked the cut ends together and to the muscle. I sutured the bottom portion of the mesh every half cm to the shelving edge.  The lateral portion was laid flat under the oblique and this covered the defect well. I then obtained hemostasis.  I then closed the external oblique with 2-0 vicryl Scarpas was closed with 3-0 vicryl The skin was closed with 4-0 monocryl and glue.  He tolerated this well and was transferred to pacu stable.

## 2015-04-27 NOTE — H&P (Signed)
  [age] yom who presents after having a pulling pain in left groin while lifting. He now notes a bulge. He has one episode of pain requiring manual reduction. This is not interfering with his activity. He has prior right inguinal hernia repair. He also has had his right lower leg impaled by pine branch and is undergoing wound care for this.  Other Problems  Arthritis Atrial Fibrillation Hemorrhoids Umbilical Hernia Repair  Past Surgical History  Appendectomy Open Inguinal Hernia Surgery Right.  Diagnostic Studies History  Colonoscopy within last year  Allergies  No Known Drug Allergies04/29/2016  Medication History  No Current Medications Medications Reconciled  Social History  Alcohol use Remotely quit alcohol use. Caffeine use Carbonated beverages, Coffee. No drug use Tobacco use Former smoker.  Family History  Alcohol Abuse Father. Arthritis Mother. Colon Cancer Brother. Diabetes Mellitus Mother, Sister. Heart Disease Father.  Review of Systems  General Not Present- Appetite Loss, Chills, Fatigue, Fever, Night Sweats, Weight Gain and Weight Loss. Skin Present- Rash. Not Present- Change in Wart/Mole, Dryness, Hives, Jaundice, New Lesions, Non-Healing Wounds and Ulcer. HEENT Present- Hearing Loss and Wears glasses/contact lenses. Not Present- Earache, Hoarseness, Nose Bleed, Oral Ulcers, Ringing in the Ears, Seasonal Allergies, Sinus Pain, Sore Throat, Visual Disturbances and Yellow Eyes. Respiratory Not Present- Bloody sputum, Chronic Cough, Difficulty Breathing, Snoring and Wheezing. Breast Not Present- Breast Mass, Breast Pain, Nipple Discharge and Skin Changes. Cardiovascular Not Present- Chest Pain, Difficulty Breathing Lying Down, Leg Cramps, Palpitations, Rapid Heart Rate, Shortness of Breath and Swelling of Extremities. Gastrointestinal Not Present- Abdominal Pain, Bloating, Bloody Stool, Change in Bowel Habits, Chronic diarrhea, Constipation,  Difficulty Swallowing, Excessive gas, Gets full quickly at meals, Hemorrhoids, Indigestion, Nausea, Rectal Pain and Vomiting. Male Genitourinary Not Present- Blood in Urine, Change in Urinary Stream, Frequency, Impotence, Nocturia, Painful Urination, Urgency and Urine Leakage. Musculoskeletal Present- Back Pain, Joint Pain and Muscle Pain. Not Present- Joint Stiffness, Muscle Weakness and Swelling of Extremities. Neurological Not Present- Decreased Memory, Fainting, Headaches, Numbness, Seizures, Tingling, Tremor, Trouble walking and Weakness. Psychiatric Not Present- Anxiety, Bipolar, Change in Sleep Pattern, Depression, Fearful and Frequent crying. Endocrine Not Present- Cold Intolerance, Excessive Hunger, Hair Changes, Heat Intolerance, Hot flashes and New Diabetes. Hematology Not Present- Easy Bruising, Excessive bleeding, Gland problems, HIV and Persistent Infections.   Vitals Weight: 239 lb Height: 71in Body Surface Area: 2.33 m Body Mass Index: 33.33 kg/m Temp.: 97.89F(Temporal)  Pulse: 81 (Regular)  BP: 132/78 (Sitting, Left Arm, Standard) Physical Exam  General Mental Status-Alert. Orientation-Oriented X3. Chest and Lung Exam Chest and lung exam reveals -on auscultation, normal breath sounds, no adventitious sounds and normal vocal resonance. Cardiovascular Cardiovascular examination reveals -normal heart sounds, regular rate and rhythm with no murmurs. Abdomen Note: soft nt/nd bs present small lih nontender  Assessment & Plan  LEFT INGUINAL HERNIA (550.90  K40.90) LIH repair

## 2015-04-28 ENCOUNTER — Encounter (HOSPITAL_COMMUNITY): Payer: Self-pay | Admitting: General Surgery

## 2015-04-29 DIAGNOSIS — S81809A Unspecified open wound, unspecified lower leg, initial encounter: Secondary | ICD-10-CM | POA: Diagnosis not present

## 2015-05-02 ENCOUNTER — Encounter (HOSPITAL_BASED_OUTPATIENT_CLINIC_OR_DEPARTMENT_OTHER): Payer: Medicare PPO | Attending: Plastic Surgery

## 2015-05-02 DIAGNOSIS — L97912 Non-pressure chronic ulcer of unspecified part of right lower leg with fat layer exposed: Secondary | ICD-10-CM | POA: Diagnosis not present

## 2015-05-02 DIAGNOSIS — M1990 Unspecified osteoarthritis, unspecified site: Secondary | ICD-10-CM | POA: Diagnosis not present

## 2015-05-16 DIAGNOSIS — E785 Hyperlipidemia, unspecified: Secondary | ICD-10-CM | POA: Diagnosis not present

## 2015-05-16 DIAGNOSIS — R7301 Impaired fasting glucose: Secondary | ICD-10-CM | POA: Diagnosis not present

## 2015-05-16 DIAGNOSIS — L97912 Non-pressure chronic ulcer of unspecified part of right lower leg with fat layer exposed: Secondary | ICD-10-CM | POA: Diagnosis not present

## 2015-05-16 DIAGNOSIS — Z79899 Other long term (current) drug therapy: Secondary | ICD-10-CM | POA: Diagnosis not present

## 2015-05-16 DIAGNOSIS — M1990 Unspecified osteoarthritis, unspecified site: Secondary | ICD-10-CM | POA: Diagnosis not present

## 2015-05-17 NOTE — H&P (Signed)
   Chief Complaint  Patient seen for complaints of Non-Healing Wound.    History of Present Illness (HPI) Wound incurred 10/2014. Reports a stick was impaled in his foot and got caught in tractor. Patient himself used knife to cut himself free. Treated as inpatient at [facility] and required two surgeries, had VAC.  05/16/15 Current collagen, compression stocking and original boot used after first surgery. (he has "leaky valve" on LLE and used compression on prior to injury).  Base of wound is AT tendon, granulated no exposure but is mobile in base of wound.   Objective  Constitutional Vitals Time Taken: 12:40 PM, Height: 71 in, Source: Stated, Weight: 231 lbs, Source: Stated, BMI: 32.2, Temperature: 98.0 F, Pulse: 68 bpm, Respiratory Rate: 20 breaths/min, Blood Pressure: 120/54 mmHg.   GEN: alert, NAD CV: normal heart sounds PULM: clear to auscultation Integumentary (Hair, Skin) Wound #1 status is Open. Original cause of wound was Trauma. The wound is located on the Right,Anterior Lower Leg. The wound measures 1.1cm length x 1.2cm width x 0.2cm depth; 1.037cm^2 area and 0.207cm^3 volume. There is muscle exposed. There is no tunneling or undermining noted. There is a medium amount of serosanguineous drainage noted. There is medium (34-66%) pink granulation within the wound bed. There is a small (1-33%) amount of necrotic tissue within the wound bed including Adherent Slough. The periwound skin appearance had no abnormalities noted for moisture. The periwound skin appearance exhibited: Localized Edema, Rash, Erythema.  The surrounding wound skin color is noted with erythema which is circumferential.   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 Post procedure patient will have bolster dressings, needs to plan off feet and boot  for all ambulation for 5-10 days.  Glenna Fellows, MD [facility] Plastic & Reconstructive Surgery [phone]

## 2015-05-19 ENCOUNTER — Encounter (HOSPITAL_BASED_OUTPATIENT_CLINIC_OR_DEPARTMENT_OTHER): Payer: Self-pay | Admitting: *Deleted

## 2015-05-20 ENCOUNTER — Other Ambulatory Visit (HOSPITAL_COMMUNITY): Payer: Medicare PPO

## 2015-05-24 ENCOUNTER — Ambulatory Visit (HOSPITAL_BASED_OUTPATIENT_CLINIC_OR_DEPARTMENT_OTHER): Payer: Medicare PPO | Admitting: Anesthesiology

## 2015-05-24 ENCOUNTER — Encounter (HOSPITAL_BASED_OUTPATIENT_CLINIC_OR_DEPARTMENT_OTHER)
Admission: RE | Disposition: A | Discharge status: Home / Self Care | Payer: Self-pay | Source: Ambulatory Visit | Attending: Plastic Surgery

## 2015-05-24 ENCOUNTER — Encounter (HOSPITAL_BASED_OUTPATIENT_CLINIC_OR_DEPARTMENT_OTHER): Payer: Self-pay

## 2015-05-24 ENCOUNTER — Ambulatory Visit (HOSPITAL_BASED_OUTPATIENT_CLINIC_OR_DEPARTMENT_OTHER)
Admission: RE | Admit: 2015-05-24 | Discharge: 2015-05-24 | Disposition: A | Discharge status: Home / Self Care | Payer: Medicare PPO | Source: Ambulatory Visit | Attending: Plastic Surgery | Admitting: Plastic Surgery

## 2015-05-24 DIAGNOSIS — L97812 Non-pressure chronic ulcer of other part of right lower leg with fat layer exposed: Secondary | ICD-10-CM | POA: Insufficient documentation

## 2015-05-24 DIAGNOSIS — L97919 Non-pressure chronic ulcer of unspecified part of right lower leg with unspecified severity: Secondary | ICD-10-CM | POA: Diagnosis not present

## 2015-05-24 HISTORY — PX: I&D EXTREMITY: SHX5045

## 2015-05-24 LAB — POCT HEMOGLOBIN-HEMACUE: Hemoglobin: 14.5 g/dL (ref 13.0–17.0)

## 2015-05-24 SURGERY — IRRIGATION AND DEBRIDEMENT EXTREMITY
Anesthesia: Monitor Anesthesia Care | Site: Leg Lower | Laterality: Right

## 2015-05-24 MED ORDER — CEFAZOLIN SODIUM-DEXTROSE 2-3 GM-% IV SOLR: 2.0000 g | INTRAVENOUS | Status: DC

## 2015-05-24 MED ORDER — FENTANYL CITRATE (PF) 100 MCG/2ML IJ SOLN
INTRAMUSCULAR | Status: DC | PRN
  Administered 2015-05-24 (×2): 50 ug via INTRAVENOUS

## 2015-05-24 MED ORDER — LIDOCAINE-EPINEPHRINE 1 %-1:100000 IJ SOLN
INTRAMUSCULAR | Status: DC | PRN
  Administered 2015-05-24: 4 mL

## 2015-05-24 MED ORDER — PROPOFOL 10 MG/ML IV BOLUS
INTRAVENOUS | Status: AC
  Filled 2015-05-24: qty 80

## 2015-05-24 MED ORDER — GLYCOPYRROLATE 0.2 MG/ML IJ SOLN: 0.2000 mg | Freq: Once | INTRAMUSCULAR | Status: DC | PRN

## 2015-05-24 MED ORDER — PROPOFOL INFUSION 10 MG/ML OPTIME
INTRAVENOUS | Status: DC | PRN
  Administered 2015-05-24: 50 ug/kg/min via INTRAVENOUS

## 2015-05-24 MED ORDER — CEFAZOLIN SODIUM-DEXTROSE 2-3 GM-% IV SOLR
INTRAVENOUS | Status: AC
  Filled 2015-05-24: qty 50

## 2015-05-24 MED ORDER — CEFAZOLIN SODIUM-DEXTROSE 2-3 GM-% IV SOLR
2.0000 g | INTRAVENOUS | Status: AC
  Administered 2015-05-24: 2 g via INTRAVENOUS

## 2015-05-24 MED ORDER — LIDOCAINE HCL (CARDIAC) 20 MG/ML IV SOLN
INTRAVENOUS | Status: DC | PRN
  Administered 2015-05-24: 30 mg via INTRAVENOUS

## 2015-05-24 MED ORDER — FENTANYL CITRATE (PF) 100 MCG/2ML IJ SOLN: 50.0000 ug | INTRAMUSCULAR | Status: DC | PRN

## 2015-05-24 MED ORDER — GLYCOPYRROLATE 0.2 MG/ML IJ SOLN
INTRAMUSCULAR | Status: DC | PRN
  Administered 2015-05-24: 0.2 mg via INTRAVENOUS

## 2015-05-24 MED ORDER — SCOPOLAMINE 1 MG/3DAYS TD PT72: 1.0000 | MEDICATED_PATCH | Freq: Once | TRANSDERMAL | Status: DC | PRN

## 2015-05-24 MED ORDER — LIDOCAINE-EPINEPHRINE (PF) 1 %-1:200000 IJ SOLN
INTRAMUSCULAR | Status: AC
  Filled 2015-05-24: qty 10

## 2015-05-24 MED ORDER — BUPIVACAINE-EPINEPHRINE (PF) 0.25% -1:200000 IJ SOLN
INTRAMUSCULAR | Status: AC
  Filled 2015-05-24: qty 30

## 2015-05-24 MED ORDER — PROPOFOL 500 MG/50ML IV EMUL
INTRAVENOUS | Status: AC
  Filled 2015-05-24: qty 50

## 2015-05-24 MED ORDER — MEPERIDINE HCL 25 MG/ML IJ SOLN: 6.2500 mg | INTRAMUSCULAR | Status: DC | PRN

## 2015-05-24 MED ORDER — MIDAZOLAM HCL 2 MG/2ML IJ SOLN: 1.0000 mg | INTRAMUSCULAR | Status: DC | PRN

## 2015-05-24 MED ORDER — LACTATED RINGERS IV SOLN
INTRAVENOUS | Status: DC
  Administered 2015-05-24 (×2): via INTRAVENOUS

## 2015-05-24 MED ORDER — FENTANYL CITRATE (PF) 100 MCG/2ML IJ SOLN
INTRAMUSCULAR | Status: AC
  Filled 2015-05-24: qty 4

## 2015-05-24 MED ORDER — FENTANYL CITRATE (PF) 100 MCG/2ML IJ SOLN: 25.0000 ug | INTRAMUSCULAR | Status: DC | PRN

## 2015-05-24 MED ORDER — LIDOCAINE-EPINEPHRINE 1 %-1:100000 IJ SOLN
INTRAMUSCULAR | Status: AC
  Filled 2015-05-24: qty 1

## 2015-05-24 SURGICAL SUPPLY — 65 items
BANDAGE ELASTIC 3 VELCRO ST LF (GAUZE/BANDAGES/DRESSINGS) IMPLANT
BANDAGE ELASTIC 4 VELCRO ST LF (GAUZE/BANDAGES/DRESSINGS) ×6 IMPLANT
BANDAGE ELASTIC 6 VELCRO ST LF (GAUZE/BANDAGES/DRESSINGS) IMPLANT
BLADE SURG 10 STRL SS (BLADE) IMPLANT
BLADE SURG 15 STRL LF DISP TIS (BLADE) ×1 IMPLANT
BLADE SURG 15 STRL SS (BLADE) ×2
BNDG GAUZE ELAST 4 BULKY (GAUZE/BANDAGES/DRESSINGS) ×3 IMPLANT
CANISTER SUCT 1200ML W/VALVE (MISCELLANEOUS) IMPLANT
CHLORAPREP W/TINT 26ML (MISCELLANEOUS) IMPLANT
COVER BACK TABLE 60X90IN (DRAPES) ×3 IMPLANT
COVER MAYO STAND STRL (DRAPES) ×3 IMPLANT
DECANTER SPIKE VIAL GLASS SM (MISCELLANEOUS) IMPLANT
DRAPE U 20/CS (DRAPES) IMPLANT
DRSG ADAPTIC 3X8 NADH LF (GAUZE/BANDAGES/DRESSINGS) IMPLANT
DRSG EMULSION OIL 3X3 NADH (GAUZE/BANDAGES/DRESSINGS) ×3 IMPLANT
DRSG PAD ABDOMINAL 8X10 ST (GAUZE/BANDAGES/DRESSINGS) IMPLANT
ELECT COATED BLADE 2.86 ST (ELECTRODE) IMPLANT
ELECT REM PT RETURN 9FT ADLT (ELECTROSURGICAL) ×3
ELECTRODE REM PT RTRN 9FT ADLT (ELECTROSURGICAL) ×1 IMPLANT
GAUZE SPONGE 4X4 12PLY STRL (GAUZE/BANDAGES/DRESSINGS) ×3 IMPLANT
GAUZE XEROFORM 1X8 LF (GAUZE/BANDAGES/DRESSINGS) IMPLANT
GAUZE XEROFORM 5X9 LF (GAUZE/BANDAGES/DRESSINGS) IMPLANT
GLOVE BIO SURGEON STRL SZ 6 (GLOVE) ×3 IMPLANT
GLOVE BIO SURGEON STRL SZ 6.5 (GLOVE) ×2 IMPLANT
GLOVE BIO SURGEONS STRL SZ 6.5 (GLOVE) ×1
GLOVE BIOGEL PI IND STRL 7.0 (GLOVE) ×1 IMPLANT
GLOVE BIOGEL PI INDICATOR 7.0 (GLOVE) ×2
GOWN STRL REUS W/ TWL LRG LVL3 (GOWN DISPOSABLE) ×2 IMPLANT
GOWN STRL REUS W/TWL LRG LVL3 (GOWN DISPOSABLE) ×4
MATRIX WOUND MESHED 2X2 (Tissue) ×1 IMPLANT
NEEDLE HYPO 25X1 1.5 SAFETY (NEEDLE) IMPLANT
NEEDLE PRECISIONGLIDE 27X1.5 (NEEDLE) ×3 IMPLANT
NS IRRIG 1000ML POUR BTL (IV SOLUTION) ×3 IMPLANT
PACK BASIN DAY SURGERY FS (CUSTOM PROCEDURE TRAY) ×3 IMPLANT
PADDING CAST ABS 3INX4YD NS (CAST SUPPLIES)
PADDING CAST ABS 4INX4YD NS (CAST SUPPLIES)
PADDING CAST ABS COTTON 3X4 (CAST SUPPLIES) IMPLANT
PADDING CAST ABS COTTON 4X4 ST (CAST SUPPLIES) IMPLANT
PENCIL BUTTON HOLSTER BLD 10FT (ELECTRODE) IMPLANT
SHEET MEDIUM DRAPE 40X70 STRL (DRAPES) ×3 IMPLANT
SLEEVE SCD COMPRESS KNEE MED (MISCELLANEOUS) IMPLANT
SPLINT PLASTER CAST XFAST 3X15 (CAST SUPPLIES) IMPLANT
SPLINT PLASTER XTRA FASTSET 3X (CAST SUPPLIES)
SPONGE GAUZE 4X4 12PLY STER LF (GAUZE/BANDAGES/DRESSINGS) IMPLANT
SPONGE LAP 18X18 X RAY DECT (DISPOSABLE) ×3 IMPLANT
STAPLER VISISTAT 35W (STAPLE) IMPLANT
SUCTION FRAZIER TIP 10 FR DISP (SUCTIONS) IMPLANT
SURGILUBE 2OZ TUBE FLIPTOP (MISCELLANEOUS) IMPLANT
SUT CHROMIC 4 0 PS 2 18 (SUTURE) ×3 IMPLANT
SUT ETHILON 3 0 PS 1 (SUTURE) IMPLANT
SUT ETHILON 4 0 P 3 18 (SUTURE) IMPLANT
SUT PROLENE 3 0 PS 2 (SUTURE) IMPLANT
SUT SILK 3 0 PS 1 (SUTURE) IMPLANT
SUT SILK 4 0 PS 2 (SUTURE) ×3 IMPLANT
SUT VIC AB 3-0 FS2 27 (SUTURE) IMPLANT
SUT VIC AB 5-0 PS2 18 (SUTURE) IMPLANT
SYR BULB IRRIGATION 50ML (SYRINGE) ×3 IMPLANT
SYR CONTROL 10ML LL (SYRINGE) ×3 IMPLANT
TOWEL OR 17X24 6PK STRL BLUE (TOWEL DISPOSABLE) ×3 IMPLANT
TRAY DSU PREP LF (CUSTOM PROCEDURE TRAY) ×3 IMPLANT
TUBE CONNECTING 20'X1/4 (TUBING)
TUBE CONNECTING 20X1/4 (TUBING) IMPLANT
UNDERPAD 30X30 (UNDERPADS AND DIAPERS) ×3 IMPLANT
WOUND MATRIX MESHED 2X2 (Tissue) ×2 IMPLANT
YANKAUER SUCT BULB TIP NO VENT (SUCTIONS) IMPLANT

## 2015-05-24 NOTE — Transfer of Care (Signed)
Immediate Anesthesia Transfer of Care Note  Patient: Roger George  Procedure(s) Performed: Procedure(s): SURGICAL PREPARATION OF RIGHT LEG 2 CM APPLICATION OF INTEGRA (Right)  Patient Location: PACU  Anesthesia Type:MAC  Level of Consciousness: awake and patient cooperative  Airway & Oxygen Therapy: Patient Spontanous Breathing and Patient connected to face mask oxygen  Post-op Assessment: Report given to RN and Post -op Vital signs reviewed and stable  Post vital signs: Reviewed and stable  Last Vitals:  Filed Vitals:   05/24/15 0651  BP: 128/93  Pulse: 50  Temp: 36.4 C  Resp: 20    Complications: No apparent anesthesia complications

## 2015-05-24 NOTE — Anesthesia Postprocedure Evaluation (Signed)
  Anesthesia Post-op Note  Patient: Roger George  Procedure(s) Performed: Procedure(s): SURGICAL PREPARATION OF RIGHT LEG 2 CM APPLICATION OF INTEGRA AND WOUND DEBRIDEMENT (Right)  Patient Location: PACU  Anesthesia Type: MAC   Level of Consciousness: awake, alert  and oriented  Airway and Oxygen Therapy: Patient Spontanous Breathing  Post-op Pain: none  Post-op Assessment: Post-op Vital signs reviewed  Post-op Vital Signs: Reviewed  Last Vitals:  Filed Vitals:   05/24/15 0857  BP: 133/76  Pulse: 55  Temp: 36.7 C  Resp: 16    Complications: No apparent anesthesia complications

## 2015-05-24 NOTE — Interval H&P Note (Signed)
History and Physical Interval Note:  05/24/2015 6:49 AM  Roger George  has presented today for surgery, with the diagnosis of nonpressure ulcer right leg with muscel layer exposed  The various methods of treatment have been discussed with the patient and family. After consideration of risks, benefits and other options for treatment, the patient has consented to  Procedure(s): SURGICAL PREPARATION OF RIGHT LEG 2 CM APPLICATION OF INTEGRA (Right) as a surgical intervention .  The patient's history has been reviewed, patient examined, no change in status, stable for surgery.  I have reviewed the patient's chart and labs.  Questions were answered to the patient's satisfaction.     Genaro Bekker

## 2015-05-24 NOTE — Discharge Instructions (Signed)

## 2015-05-24 NOTE — Op Note (Signed)
Operative Note   DATE OF OPERATION: 8.2.2016  LOCATION: [facility]- outpatient  SURGICAL DIVISION: Plastic Surgery  PREOPERATIVE DIAGNOSES:  1. Chronic ulcer right leg non pressure  POSTOPERATIVE DIAGNOSES:  same  PROCEDURE:  1. Surgical preparation for grafting right leg 1 cm2 2. Application Integra right leg 1.5 cm 2  SURGEON: Glenna Fellows MD MBA  ASSISTANT: none  ANESTHESIA:  MAC.   EBL: minimal  COMPLICATIONS: None immediate.   INDICATIONS FOR PROCEDURE:  The patient, Roger George, is a 74 y.o. male born on 01-26-1941, is here for debridement and application Integra over chronic ulcer that resulted from trauma with exposed anterior tibialis tendon.   FINDINGS: Following debridement, 1 x 1 cm open wound over anterior tibialis tendon with undermining from 1 o clock to 4 o clock up to 1 cm.   DESCRIPTION OF PROCEDURE:  The patient's operative site was marked with the patient in the preoperative area. The patient was taken to the operating room. IV antibiotics were given. The patient's operative site was prepped and draped in a sterile fashion. A time out was performed and all information was confirmed to be correct. Local anesthetic infiltrated surrounding open wound. Sharp excision of skin margins completed with knife and area of pseudoepithelization along undersurface of undermined wound edges excised. Slough removed with knife over base of wound which represented tendon. Wound irrigated. Pre meshed Integra prepared, inset over wound with 4-0 chromic. Adpatic and sterile sponge sewn as bolster with 4-0 silk. Dry dressing and Ace wrap applied. Walking boot placed for immobilization.  The patient was allowed to wake from anesthesia, extubated and taken to the recovery room in satisfactory condition.   SPECIMENS: none  DRAINS: none  Glenna Fellows, MD [facility] Plastic & Reconstructive Surgery [phone]

## 2015-05-24 NOTE — Anesthesia Procedure Notes (Signed)
Procedure Name: MAC Date/Time: 05/24/2015 7:29 AM Performed by: Goldsmith Desanctis Pre-anesthesia Checklist: Patient identified, Timeout performed, Emergency Drugs available, Suction available and Patient being monitored Patient Re-evaluated:Patient Re-evaluated prior to inductionOxygen Delivery Method: Simple face mask

## 2015-05-24 NOTE — Anesthesia Preprocedure Evaluation (Signed)
Anesthesia Evaluation  Patient identified by MRN, date of birth, ID band Patient awake    Reviewed: Allergy & Precautions, NPO status , Patient's Chart, lab work & pertinent test results  Airway Mallampati: I  TM Distance: >3 FB Neck ROM: Full    Dental  (+) Teeth Intact, Dental Advisory Given   Pulmonary  breath sounds clear to auscultation        Cardiovascular Rhythm:Regular Rate:Normal     Neuro/Psych    GI/Hepatic   Endo/Other    Renal/GU      Musculoskeletal   Abdominal   Peds  Hematology   Anesthesia Other Findings   Reproductive/Obstetrics                             Anesthesia Physical Anesthesia Plan  ASA: I  Anesthesia Plan: MAC   Post-op Pain Management:    Induction: Intravenous  Airway Management Planned: Simple Face Mask  Additional Equipment:   Intra-op Plan:   Post-operative Plan:   Informed Consent: I have reviewed the patients History and Physical, chart, labs and discussed the procedure including the risks, benefits and alternatives for the proposed anesthesia with the patient or authorized representative who has indicated his/her understanding and acceptance.   Dental advisory given  Plan Discussed with: CRNA, Anesthesiologist and Surgeon  Anesthesia Plan Comments:         Anesthesia Quick Evaluation  

## 2015-05-25 ENCOUNTER — Encounter (HOSPITAL_BASED_OUTPATIENT_CLINIC_OR_DEPARTMENT_OTHER): Payer: Self-pay | Admitting: Plastic Surgery

## 2015-05-30 ENCOUNTER — Encounter (HOSPITAL_BASED_OUTPATIENT_CLINIC_OR_DEPARTMENT_OTHER): Payer: Medicare PPO | Attending: Plastic Surgery

## 2015-05-30 DIAGNOSIS — S81809A Unspecified open wound, unspecified lower leg, initial encounter: Secondary | ICD-10-CM | POA: Diagnosis not present

## 2015-05-30 DIAGNOSIS — L97912 Non-pressure chronic ulcer of unspecified part of right lower leg with fat layer exposed: Secondary | ICD-10-CM | POA: Diagnosis not present

## 2015-06-07 DIAGNOSIS — E119 Type 2 diabetes mellitus without complications: Secondary | ICD-10-CM | POA: Diagnosis not present

## 2015-06-07 DIAGNOSIS — Z0001 Encounter for general adult medical examination with abnormal findings: Secondary | ICD-10-CM | POA: Diagnosis not present

## 2015-06-07 DIAGNOSIS — I4892 Unspecified atrial flutter: Secondary | ICD-10-CM | POA: Diagnosis not present

## 2015-06-07 DIAGNOSIS — Z6834 Body mass index (BMI) 34.0-34.9, adult: Secondary | ICD-10-CM | POA: Diagnosis not present

## 2015-06-13 DIAGNOSIS — S81809A Unspecified open wound, unspecified lower leg, initial encounter: Secondary | ICD-10-CM | POA: Diagnosis not present

## 2015-06-13 DIAGNOSIS — L97912 Non-pressure chronic ulcer of unspecified part of right lower leg with fat layer exposed: Secondary | ICD-10-CM | POA: Diagnosis not present

## 2015-07-04 ENCOUNTER — Encounter (HOSPITAL_BASED_OUTPATIENT_CLINIC_OR_DEPARTMENT_OTHER): Payer: Medicare PPO | Attending: Plastic Surgery

## 2015-07-04 DIAGNOSIS — L97812 Non-pressure chronic ulcer of other part of right lower leg with fat layer exposed: Secondary | ICD-10-CM | POA: Insufficient documentation

## 2015-07-04 DIAGNOSIS — I499 Cardiac arrhythmia, unspecified: Secondary | ICD-10-CM | POA: Diagnosis not present

## 2015-07-04 DIAGNOSIS — M1990 Unspecified osteoarthritis, unspecified site: Secondary | ICD-10-CM | POA: Diagnosis not present

## 2015-07-25 ENCOUNTER — Encounter (HOSPITAL_BASED_OUTPATIENT_CLINIC_OR_DEPARTMENT_OTHER): Payer: Medicare PPO | Attending: Plastic Surgery

## 2015-07-25 DIAGNOSIS — W268XXA Contact with other sharp object(s), not elsewhere classified, initial encounter: Secondary | ICD-10-CM | POA: Diagnosis not present

## 2015-07-25 DIAGNOSIS — S81831A Puncture wound without foreign body, right lower leg, initial encounter: Secondary | ICD-10-CM | POA: Diagnosis not present

## 2015-07-25 DIAGNOSIS — M1990 Unspecified osteoarthritis, unspecified site: Secondary | ICD-10-CM | POA: Diagnosis not present

## 2015-07-25 DIAGNOSIS — W309XXA Contact with unspecified agricultural machinery, initial encounter: Secondary | ICD-10-CM | POA: Insufficient documentation

## 2015-07-25 DIAGNOSIS — I499 Cardiac arrhythmia, unspecified: Secondary | ICD-10-CM | POA: Diagnosis not present

## 2015-08-02 DIAGNOSIS — L97911 Non-pressure chronic ulcer of unspecified part of right lower leg limited to breakdown of skin: Secondary | ICD-10-CM | POA: Diagnosis not present

## 2015-08-02 DIAGNOSIS — M79661 Pain in right lower leg: Secondary | ICD-10-CM | POA: Diagnosis not present

## 2015-08-02 DIAGNOSIS — S81801D Unspecified open wound, right lower leg, subsequent encounter: Secondary | ICD-10-CM | POA: Diagnosis not present

## 2015-08-05 DIAGNOSIS — M79661 Pain in right lower leg: Secondary | ICD-10-CM | POA: Diagnosis not present

## 2015-08-05 DIAGNOSIS — L97911 Non-pressure chronic ulcer of unspecified part of right lower leg limited to breakdown of skin: Secondary | ICD-10-CM | POA: Diagnosis not present

## 2015-08-05 DIAGNOSIS — S81801D Unspecified open wound, right lower leg, subsequent encounter: Secondary | ICD-10-CM | POA: Diagnosis not present

## 2015-08-10 DIAGNOSIS — M79661 Pain in right lower leg: Secondary | ICD-10-CM | POA: Diagnosis not present

## 2015-08-10 DIAGNOSIS — S81801D Unspecified open wound, right lower leg, subsequent encounter: Secondary | ICD-10-CM | POA: Diagnosis not present

## 2015-08-10 DIAGNOSIS — L97911 Non-pressure chronic ulcer of unspecified part of right lower leg limited to breakdown of skin: Secondary | ICD-10-CM | POA: Diagnosis not present

## 2015-08-12 DIAGNOSIS — M79661 Pain in right lower leg: Secondary | ICD-10-CM | POA: Diagnosis not present

## 2015-08-12 DIAGNOSIS — L97911 Non-pressure chronic ulcer of unspecified part of right lower leg limited to breakdown of skin: Secondary | ICD-10-CM | POA: Diagnosis not present

## 2015-08-12 DIAGNOSIS — S81801D Unspecified open wound, right lower leg, subsequent encounter: Secondary | ICD-10-CM | POA: Diagnosis not present

## 2015-08-15 DIAGNOSIS — M1990 Unspecified osteoarthritis, unspecified site: Secondary | ICD-10-CM | POA: Diagnosis not present

## 2015-08-15 DIAGNOSIS — S81831A Puncture wound without foreign body, right lower leg, initial encounter: Secondary | ICD-10-CM | POA: Diagnosis not present

## 2015-08-15 DIAGNOSIS — I499 Cardiac arrhythmia, unspecified: Secondary | ICD-10-CM | POA: Diagnosis not present

## 2015-08-16 DIAGNOSIS — L97911 Non-pressure chronic ulcer of unspecified part of right lower leg limited to breakdown of skin: Secondary | ICD-10-CM | POA: Diagnosis not present

## 2015-08-16 DIAGNOSIS — S81801D Unspecified open wound, right lower leg, subsequent encounter: Secondary | ICD-10-CM | POA: Diagnosis not present

## 2015-08-16 DIAGNOSIS — M79661 Pain in right lower leg: Secondary | ICD-10-CM | POA: Diagnosis not present

## 2015-08-18 DIAGNOSIS — S81801D Unspecified open wound, right lower leg, subsequent encounter: Secondary | ICD-10-CM | POA: Diagnosis not present

## 2015-08-18 DIAGNOSIS — L97911 Non-pressure chronic ulcer of unspecified part of right lower leg limited to breakdown of skin: Secondary | ICD-10-CM | POA: Diagnosis not present

## 2015-08-18 DIAGNOSIS — M79661 Pain in right lower leg: Secondary | ICD-10-CM | POA: Diagnosis not present

## 2015-08-23 DIAGNOSIS — M79661 Pain in right lower leg: Secondary | ICD-10-CM | POA: Diagnosis not present

## 2015-08-23 DIAGNOSIS — L97911 Non-pressure chronic ulcer of unspecified part of right lower leg limited to breakdown of skin: Secondary | ICD-10-CM | POA: Diagnosis not present

## 2015-08-23 DIAGNOSIS — S81801D Unspecified open wound, right lower leg, subsequent encounter: Secondary | ICD-10-CM | POA: Diagnosis not present

## 2015-08-25 DIAGNOSIS — L97911 Non-pressure chronic ulcer of unspecified part of right lower leg limited to breakdown of skin: Secondary | ICD-10-CM | POA: Diagnosis not present

## 2015-08-25 DIAGNOSIS — M79661 Pain in right lower leg: Secondary | ICD-10-CM | POA: Diagnosis not present

## 2015-08-25 DIAGNOSIS — S81801D Unspecified open wound, right lower leg, subsequent encounter: Secondary | ICD-10-CM | POA: Diagnosis not present

## 2015-08-29 ENCOUNTER — Encounter (HOSPITAL_BASED_OUTPATIENT_CLINIC_OR_DEPARTMENT_OTHER): Payer: Medicare PPO | Attending: Plastic Surgery

## 2015-08-29 DIAGNOSIS — S81831A Puncture wound without foreign body, right lower leg, initial encounter: Secondary | ICD-10-CM | POA: Insufficient documentation

## 2015-08-29 DIAGNOSIS — W268XXA Contact with other sharp object(s), not elsewhere classified, initial encounter: Secondary | ICD-10-CM | POA: Insufficient documentation

## 2015-08-29 DIAGNOSIS — M1990 Unspecified osteoarthritis, unspecified site: Secondary | ICD-10-CM | POA: Insufficient documentation

## 2015-08-29 DIAGNOSIS — W309XXA Contact with unspecified agricultural machinery, initial encounter: Secondary | ICD-10-CM | POA: Insufficient documentation

## 2015-09-01 DIAGNOSIS — Z719 Counseling, unspecified: Secondary | ICD-10-CM | POA: Diagnosis not present

## 2015-09-01 DIAGNOSIS — J029 Acute pharyngitis, unspecified: Secondary | ICD-10-CM | POA: Diagnosis not present

## 2015-09-01 DIAGNOSIS — J189 Pneumonia, unspecified organism: Secondary | ICD-10-CM | POA: Diagnosis not present

## 2015-09-12 DIAGNOSIS — J189 Pneumonia, unspecified organism: Secondary | ICD-10-CM | POA: Diagnosis not present

## 2015-09-12 DIAGNOSIS — M25531 Pain in right wrist: Secondary | ICD-10-CM | POA: Diagnosis not present

## 2015-09-12 DIAGNOSIS — M1990 Unspecified osteoarthritis, unspecified site: Secondary | ICD-10-CM | POA: Diagnosis not present

## 2015-09-12 DIAGNOSIS — S81831A Puncture wound without foreign body, right lower leg, initial encounter: Secondary | ICD-10-CM | POA: Diagnosis not present

## 2015-09-12 DIAGNOSIS — Z719 Counseling, unspecified: Secondary | ICD-10-CM | POA: Diagnosis not present

## 2015-09-12 DIAGNOSIS — Z23 Encounter for immunization: Secondary | ICD-10-CM | POA: Diagnosis not present

## 2015-09-21 DIAGNOSIS — Z79899 Other long term (current) drug therapy: Secondary | ICD-10-CM | POA: Diagnosis not present

## 2015-09-21 DIAGNOSIS — L409 Psoriasis, unspecified: Secondary | ICD-10-CM | POA: Diagnosis not present

## 2015-09-21 DIAGNOSIS — L570 Actinic keratosis: Secondary | ICD-10-CM | POA: Diagnosis not present

## 2019-07-31 ENCOUNTER — Other Ambulatory Visit (HOSPITAL_COMMUNITY): Payer: Self-pay | Admitting: Internal Medicine

## 2019-07-31 ENCOUNTER — Other Ambulatory Visit: Payer: Self-pay

## 2019-07-31 ENCOUNTER — Ambulatory Visit (HOSPITAL_COMMUNITY)
Admission: RE | Admit: 2019-07-31 | Discharge: 2019-07-31 | Disposition: A | Discharge status: Home / Self Care | Payer: Medicare Other | Source: Ambulatory Visit | Attending: Internal Medicine | Admitting: Internal Medicine

## 2019-07-31 DIAGNOSIS — R059 Cough, unspecified: Secondary | ICD-10-CM

## 2019-07-31 DIAGNOSIS — R05 Cough: Secondary | ICD-10-CM | POA: Diagnosis present

## 2019-11-26 DIAGNOSIS — E1159 Type 2 diabetes mellitus with other circulatory complications: Secondary | ICD-10-CM | POA: Diagnosis not present

## 2019-11-26 DIAGNOSIS — I10 Essential (primary) hypertension: Secondary | ICD-10-CM | POA: Diagnosis not present

## 2020-01-22 DIAGNOSIS — E1159 Type 2 diabetes mellitus with other circulatory complications: Secondary | ICD-10-CM | POA: Diagnosis not present

## 2020-01-25 DIAGNOSIS — I10 Essential (primary) hypertension: Secondary | ICD-10-CM | POA: Diagnosis not present

## 2020-01-25 DIAGNOSIS — E1159 Type 2 diabetes mellitus with other circulatory complications: Secondary | ICD-10-CM | POA: Diagnosis not present

## 2020-01-25 DIAGNOSIS — R7309 Other abnormal glucose: Secondary | ICD-10-CM | POA: Diagnosis not present

## 2020-04-21 DIAGNOSIS — E1159 Type 2 diabetes mellitus with other circulatory complications: Secondary | ICD-10-CM | POA: Diagnosis not present

## 2020-04-26 DIAGNOSIS — L4059 Other psoriatic arthropathy: Secondary | ICD-10-CM | POA: Diagnosis not present

## 2020-04-26 DIAGNOSIS — L570 Actinic keratosis: Secondary | ICD-10-CM | POA: Diagnosis not present

## 2020-04-26 DIAGNOSIS — Z79899 Other long term (current) drug therapy: Secondary | ICD-10-CM | POA: Diagnosis not present

## 2020-04-26 DIAGNOSIS — L400 Psoriasis vulgaris: Secondary | ICD-10-CM | POA: Diagnosis not present

## 2020-04-28 DIAGNOSIS — Z6837 Body mass index (BMI) 37.0-37.9, adult: Secondary | ICD-10-CM | POA: Diagnosis not present

## 2020-04-28 DIAGNOSIS — I10 Essential (primary) hypertension: Secondary | ICD-10-CM | POA: Diagnosis not present

## 2020-04-28 DIAGNOSIS — E1159 Type 2 diabetes mellitus with other circulatory complications: Secondary | ICD-10-CM | POA: Diagnosis not present

## 2020-04-28 DIAGNOSIS — I482 Chronic atrial fibrillation, unspecified: Secondary | ICD-10-CM | POA: Diagnosis not present

## 2020-05-02 ENCOUNTER — Telehealth: Payer: Self-pay

## 2020-05-02 NOTE — Telephone Encounter (Signed)
NOTES ON FILE FROM Carylon Perches [phone] SENT REFERRAL TO SCHEDULING

## 2020-05-03 DIAGNOSIS — Z79899 Other long term (current) drug therapy: Secondary | ICD-10-CM | POA: Diagnosis not present

## 2020-05-04 ENCOUNTER — Encounter: Payer: Self-pay | Admitting: *Deleted

## 2020-05-24 DIAGNOSIS — I4891 Unspecified atrial fibrillation: Secondary | ICD-10-CM | POA: Diagnosis not present

## 2020-05-24 DIAGNOSIS — E1159 Type 2 diabetes mellitus with other circulatory complications: Secondary | ICD-10-CM | POA: Diagnosis not present

## 2020-06-14 ENCOUNTER — Encounter: Payer: Self-pay | Admitting: Cardiology

## 2020-06-14 NOTE — Progress Notes (Signed)
Cardiology Office Note  Date: 06/15/2020   ID: Ciel, Yanes [birth year], MRN [id number]  PCP:  Carylon Perches, MD  Cardiologist:  Nona Dell, MD Electrophysiologist:  None   Chief Complaint  Patient presents with  . Atrial Fibrillation    History of Present Illness: Roger George is a 79 y.o. male referred for cardiology consultation by Dr. Ouida Sills for the evaluation of atrial fibrillation.  He is here today with his wife.  He does not report any sense of palpitations, actually remains fairly active doing outside work.  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  He describes no obvious angina symptoms, has had gradually decreasing stamina over the years, nothing acute however.  History includes atrial flutter status post radiofrequency ablation in 2007, I was not able to pull up details in the medical record.  It looks like he was previously evaluated by Dr. Ladona Ridgel.  Recent ECG in July per Dr. Ouida Sills shows atrial fibrillation, Q waves in lead III and aVF, nonspecific T wave changes.  Tracing today also shows rate controlled atrial fibrillation.  CHA2DS2-VASc score is 2-3.  He was appropriately started on Eliquis by Dr. Ouida Sills, tolerating it so far.  I did discuss bleeding risk with him today.  I reviewed his medications which are outlined below.  Past Medical History:  Diagnosis Date  . Atrial flutter (HCC)    Status post RFA  . Hyperlipidemia   . Inguinal hernia 2016  . Osteoarthritis   . Psoriasis   . Type 2 diabetes mellitus (HCC)     Past Surgical History:  Procedure Laterality Date  . ABLATION  2007   Atrial flutter  . APPENDECTOMY    . COLONOSCOPY N/A 02/17/2014   Procedure: COLONOSCOPY;  Surgeon: Malissa Hippo, MD;  [facility];  Service: Endoscopy;  Laterality: N/A;  930-moved to 1205 Ann to notify pt  . HERNIA REPAIR Right   . I & D EXTREMITY Right 05/24/2015   Procedure: SURGICAL  PREPARATION OF RIGHT LEG 2 CM APPLICATION OF INTEGRA AND WOUND DEBRIDEMENT;  Surgeon: Glenna Fellows, MD;  Location: [facility];  Service: Plastics;  Laterality: Right;  . INGUINAL HERNIA REPAIR Left 04/27/2015   Procedure: HERNIA REPAIR INGUINAL ADULT ;  Surgeon: Emelia Loron, MD;  Location: [facility] OR;  Service: General;  Laterality: Left;  . INSERTION OF MESH Left 04/27/2015   Procedure: INSERTION OF MESH;  Surgeon: Emelia Loron, MD;  Location: [facility] OR;  Service: General;  Laterality: Left;    Current Outpatient Medications  Medication Sig Dispense Refill  . acetaminophen (TYLENOL) 650 MG CR tablet Take 650 mg by mouth as needed for pain.    Marland Kitchen ELIQUIS 5 MG TABS tablet Take 1 tablet by mouth 2 (two) times daily.    . Ixekizumab (TALTZ) 80 MG/ML SOAJ Inject 1 mL into the skin every 30 (thirty) days.    . metFORMIN (GLUCOPHAGE) 1000 MG tablet Take 1 tablet by mouth 2 (two) times daily.    . valsartan (DIOVAN) 160 MG tablet Take 160 mg by mouth daily.    . vitamin B-12 (CYANOCOBALAMIN) 500 MCG tablet Take 500 mcg by mouth daily.     No current facility-administered medications for this visit.   Allergies:  Patient has no known allergies.   Social History: The patient  reports that he quit smoking about 50 years ago. He has never used smokeless tobacco. He reports  that he does not drink alcohol and does not use drugs.   Family History: The patient's family history includes CAD in his father; Diabetes in his mother and sister.   ROS:   No dizziness or syncope.  Physical Exam: VS:  BP 114/82   Pulse 82   Ht 6' (1.829 m)   Wt 261 lb 12.8 oz (118.8 kg)   SpO2 95%   BMI 35.51 kg/m , BMI Body mass index is 35.51 kg/m.  Wt Readings from Last 3 Encounters:  06/15/20 261 lb 12.8 oz (118.8 kg)  05/24/15 241 lb (109.3 kg)  04/27/15 240 lb (108.9 kg)    General: Elderly male, appears comfortable at rest. HEENT: Conjunctiva and lids normal, wearing a mask. Neck: Supple, no  elevated JVP or carotid bruits, no thyromegaly. Lungs: Clear to auscultation, nonlabored breathing at rest. Cardiac: Irregularly irregular, no S3 or significant systolic murmur, no pericardial rub. Abdomen: Soft, bowel sounds present, no guarding or rebound. Extremities: No pitting edema, distal pulses 2+. Skin: Warm and dry. Musculoskeletal: No kyphosis. Neuropsychiatric: Alert and oriented x3, affect grossly appropriate.  ECG:  An ECG dated 04/19/2015 was personally reviewed today and demonstrated:  Sinus rhythm with sinus arrhythmia.  Recent Labwork:  October 2020: BUN 17, creatinine 1.17, potassium 4.3, AST 23, ALT 25, hemoglobin 15.4 platelets 150, cholesterol 179, triglycerides 110, HDL 45, LDL 114 July 2021: Hemoglobin A1c 7.9%  Other Studies Reviewed Today:  No recent cardiac testing for review.  Assessment and Plan:  1.  Persistent atrial fibrillation, recently documented at routine physical by Dr. Ouida Sills in July.  CHA2DS2-VASc score is 2-3.  Patient is asymptomatic in terms of palpitations and heart rate is reasonable on no AV nodal blockers.  Plan is to manage as permanent atrial fibrillation with heart rate control and anticoagulation.  Continue Eliquis, follow-up CBC and BMET in 6 months.  Echocardiogram will also be obtained to assess cardiac structure and function.  2.  Remote history of atrial flutter status post radiofrequency ablation by Dr. Ladona Ridgel.  3.  Essential hypertension, on Diovan.  Blood pressure is normal today.  Medication Adjustments/Labs and Tests Ordered: Current medicines are reviewed at length with the patient today.  Concerns regarding medicines are outlined above.   Tests Ordered: Orders Placed This Encounter  Procedures  . Basic metabolic panel  . CBC  . EKG 12-Lead  . ECHOCARDIOGRAM COMPLETE    Medication Changes: No orders of the defined types were placed in this encounter.   Disposition:  Follow up 6 months.  Signed, Jonelle Sidle, MD, [facility] 06/15/2020 9:32 AM    [facility] at [facility] [address] [place], [place], [place] [address] Phone: [phone]; Fax: [phone]

## 2020-06-15 ENCOUNTER — Ambulatory Visit: Payer: Medicare PPO | Admitting: Cardiology

## 2020-06-15 ENCOUNTER — Encounter: Payer: Self-pay | Admitting: Cardiology

## 2020-06-15 ENCOUNTER — Other Ambulatory Visit: Payer: Self-pay

## 2020-06-15 VITALS — BP 114/82 | HR 82 | Ht 72.0 in | Wt 261.8 lb

## 2020-06-15 DIAGNOSIS — Z8679 Personal history of other diseases of the circulatory system: Secondary | ICD-10-CM | POA: Diagnosis not present

## 2020-06-15 DIAGNOSIS — Z79899 Other long term (current) drug therapy: Secondary | ICD-10-CM | POA: Diagnosis not present

## 2020-06-15 DIAGNOSIS — I4819 Other persistent atrial fibrillation: Secondary | ICD-10-CM

## 2020-06-15 DIAGNOSIS — I10 Essential (primary) hypertension: Secondary | ICD-10-CM

## 2020-06-15 NOTE — Patient Instructions (Addendum)
Medication Instructions:   Your physician recommends that you continue on your current medications as directed. Please refer to the Current Medication list given to you today.  Labwork:  Your physician recommends that you return for non-fasting lab work in: 6 months just before your next visit to check your BMET & CBC. This may be done at [facility] or [employer] or [employer] ([address] [place]) Monday-Friday from 8:00 am - 4:00 pm. No appointment is needed.  Testing/Procedures: Your physician has requested that you have an echocardiogram. Echocardiography is a painless test that uses sound waves to create images of your heart. It provides your doctor with information about the size and shape of your heart and how well your heart's chambers and valves are working. This procedure takes approximately one hour. There are no restrictions for this procedure.  Follow-Up:  Your physician recommends that you schedule a follow-up appointment in: 6 months.  Any Other Special Instructions Will Be Listed Below (If Applicable).  If you need a refill on your cardiac medications before your next appointment, please call your pharmacy.

## 2020-06-21 ENCOUNTER — Other Ambulatory Visit: Payer: Self-pay

## 2020-06-21 ENCOUNTER — Ambulatory Visit (INDEPENDENT_AMBULATORY_CARE_PROVIDER_SITE_OTHER): Payer: Medicare PPO

## 2020-06-21 DIAGNOSIS — I4819 Other persistent atrial fibrillation: Secondary | ICD-10-CM

## 2020-06-21 LAB — ECHOCARDIOGRAM COMPLETE
Area-P 1/2: 4.26 cm2
Calc EF: 49.6 %
MV M vel: 3.83 m/s
MV Peak grad: 58.7 mmHg
S' Lateral: 3.22 cm
Single Plane A2C EF: 48.6 %
Single Plane A4C EF: 52.6 %

## 2020-06-23 ENCOUNTER — Telehealth: Payer: Self-pay | Admitting: *Deleted

## 2020-06-23 NOTE — Telephone Encounter (Signed)
Patient informed. Copy sent to PCP °

## 2020-06-23 NOTE — Telephone Encounter (Signed)
-----   Message from Jonelle Sidle, MD sent at 06/21/2020  2:39 PM EDT ----- Results reviewed.  LVEF normal at 60 to 65%.  Left atrium is significantly dilated Continue with strategy of heart rate control with permanent atrial fibrillation.  No change to current follow-up plan.

## 2020-07-18 DIAGNOSIS — Z79899 Other long term (current) drug therapy: Secondary | ICD-10-CM | POA: Diagnosis not present

## 2020-07-18 DIAGNOSIS — E119 Type 2 diabetes mellitus without complications: Secondary | ICD-10-CM | POA: Diagnosis not present

## 2020-07-18 DIAGNOSIS — M1990 Unspecified osteoarthritis, unspecified site: Secondary | ICD-10-CM | POA: Diagnosis not present

## 2020-07-18 DIAGNOSIS — I4891 Unspecified atrial fibrillation: Secondary | ICD-10-CM | POA: Diagnosis not present

## 2020-07-25 DIAGNOSIS — I10 Essential (primary) hypertension: Secondary | ICD-10-CM | POA: Diagnosis not present

## 2020-07-25 DIAGNOSIS — R7309 Other abnormal glucose: Secondary | ICD-10-CM | POA: Diagnosis not present

## 2020-07-25 DIAGNOSIS — I482 Chronic atrial fibrillation, unspecified: Secondary | ICD-10-CM | POA: Diagnosis not present

## 2020-10-01 DIAGNOSIS — I4891 Unspecified atrial fibrillation: Secondary | ICD-10-CM | POA: Diagnosis not present

## 2020-10-01 DIAGNOSIS — R5383 Other fatigue: Secondary | ICD-10-CM | POA: Diagnosis not present

## 2020-10-01 DIAGNOSIS — E119 Type 2 diabetes mellitus without complications: Secondary | ICD-10-CM | POA: Diagnosis not present

## 2020-10-01 DIAGNOSIS — R7989 Other specified abnormal findings of blood chemistry: Secondary | ICD-10-CM | POA: Diagnosis not present

## 2020-10-18 DIAGNOSIS — I10 Essential (primary) hypertension: Secondary | ICD-10-CM | POA: Diagnosis not present

## 2020-10-18 DIAGNOSIS — E1159 Type 2 diabetes mellitus with other circulatory complications: Secondary | ICD-10-CM | POA: Diagnosis not present

## 2020-10-18 DIAGNOSIS — Z79899 Other long term (current) drug therapy: Secondary | ICD-10-CM | POA: Diagnosis not present

## 2020-10-18 DIAGNOSIS — I482 Chronic atrial fibrillation, unspecified: Secondary | ICD-10-CM | POA: Diagnosis not present

## 2020-10-25 ENCOUNTER — Other Ambulatory Visit: Payer: Self-pay | Admitting: Internal Medicine

## 2020-10-25 ENCOUNTER — Other Ambulatory Visit (HOSPITAL_COMMUNITY): Payer: Self-pay | Admitting: Internal Medicine

## 2020-10-25 DIAGNOSIS — E1122 Type 2 diabetes mellitus with diabetic chronic kidney disease: Secondary | ICD-10-CM | POA: Diagnosis not present

## 2020-10-25 DIAGNOSIS — G3184 Mild cognitive impairment, so stated: Secondary | ICD-10-CM | POA: Diagnosis not present

## 2020-10-25 DIAGNOSIS — R4189 Other symptoms and signs involving cognitive functions and awareness: Secondary | ICD-10-CM

## 2020-10-25 DIAGNOSIS — I480 Paroxysmal atrial fibrillation: Secondary | ICD-10-CM | POA: Diagnosis not present

## 2020-10-26 DIAGNOSIS — R5383 Other fatigue: Secondary | ICD-10-CM | POA: Diagnosis not present

## 2020-10-26 DIAGNOSIS — I482 Chronic atrial fibrillation, unspecified: Secondary | ICD-10-CM | POA: Diagnosis not present

## 2020-10-26 DIAGNOSIS — G3184 Mild cognitive impairment, so stated: Secondary | ICD-10-CM | POA: Diagnosis not present

## 2020-10-26 DIAGNOSIS — Z79899 Other long term (current) drug therapy: Secondary | ICD-10-CM | POA: Diagnosis not present

## 2020-10-26 DIAGNOSIS — N183 Chronic kidney disease, stage 3 unspecified: Secondary | ICD-10-CM | POA: Diagnosis not present

## 2020-11-04 ENCOUNTER — Ambulatory Visit (HOSPITAL_COMMUNITY): Payer: Medicare PPO

## 2020-11-04 ENCOUNTER — Encounter (HOSPITAL_COMMUNITY): Payer: Self-pay

## 2020-11-08 DIAGNOSIS — I4891 Unspecified atrial fibrillation: Secondary | ICD-10-CM | POA: Diagnosis not present

## 2020-11-08 DIAGNOSIS — Z20828 Contact with and (suspected) exposure to other viral communicable diseases: Secondary | ICD-10-CM | POA: Diagnosis not present

## 2020-11-08 DIAGNOSIS — R059 Cough, unspecified: Secondary | ICD-10-CM | POA: Diagnosis not present

## 2020-11-08 DIAGNOSIS — Z20822 Contact with and (suspected) exposure to covid-19: Secondary | ICD-10-CM | POA: Diagnosis not present

## 2020-11-08 DIAGNOSIS — R0602 Shortness of breath: Secondary | ICD-10-CM | POA: Diagnosis not present

## 2020-11-23 ENCOUNTER — Ambulatory Visit (HOSPITAL_COMMUNITY): Payer: Medicare PPO

## 2020-11-23 ENCOUNTER — Encounter (HOSPITAL_COMMUNITY): Payer: Self-pay

## 2020-11-23 DIAGNOSIS — R5383 Other fatigue: Secondary | ICD-10-CM | POA: Diagnosis not present

## 2020-11-23 DIAGNOSIS — R059 Cough, unspecified: Secondary | ICD-10-CM | POA: Diagnosis not present

## 2020-11-29 DIAGNOSIS — G3184 Mild cognitive impairment, so stated: Secondary | ICD-10-CM | POA: Diagnosis not present

## 2020-11-29 DIAGNOSIS — E1122 Type 2 diabetes mellitus with diabetic chronic kidney disease: Secondary | ICD-10-CM | POA: Diagnosis not present

## 2020-12-12 ENCOUNTER — Other Ambulatory Visit: Payer: Self-pay

## 2020-12-12 ENCOUNTER — Ambulatory Visit (HOSPITAL_COMMUNITY)
Admission: RE | Admit: 2020-12-12 | Discharge: 2020-12-12 | Disposition: A | Discharge status: Home / Self Care | Payer: Medicare PPO | Source: Ambulatory Visit | Attending: Internal Medicine | Admitting: Internal Medicine

## 2020-12-12 DIAGNOSIS — R413 Other amnesia: Secondary | ICD-10-CM | POA: Diagnosis not present

## 2020-12-12 DIAGNOSIS — I6782 Cerebral ischemia: Secondary | ICD-10-CM | POA: Diagnosis not present

## 2020-12-12 DIAGNOSIS — R531 Weakness: Secondary | ICD-10-CM | POA: Diagnosis not present

## 2020-12-12 DIAGNOSIS — R4189 Other symptoms and signs involving cognitive functions and awareness: Secondary | ICD-10-CM | POA: Diagnosis not present

## 2020-12-12 DIAGNOSIS — G9389 Other specified disorders of brain: Secondary | ICD-10-CM | POA: Diagnosis not present

## 2020-12-12 MED ORDER — GADOBUTROL 1 MMOL/ML IV SOLN
10.0000 mL | Freq: Once | INTRAVENOUS | Status: AC | PRN
  Administered 2020-12-12: 10 mL via INTRAVENOUS

## 2020-12-14 DIAGNOSIS — E785 Hyperlipidemia, unspecified: Secondary | ICD-10-CM | POA: Diagnosis not present

## 2020-12-14 DIAGNOSIS — I10 Essential (primary) hypertension: Secondary | ICD-10-CM | POA: Diagnosis not present

## 2020-12-14 DIAGNOSIS — E119 Type 2 diabetes mellitus without complications: Secondary | ICD-10-CM | POA: Diagnosis not present

## 2020-12-14 DIAGNOSIS — I4891 Unspecified atrial fibrillation: Secondary | ICD-10-CM | POA: Diagnosis not present

## 2020-12-15 DIAGNOSIS — E1122 Type 2 diabetes mellitus with diabetic chronic kidney disease: Secondary | ICD-10-CM | POA: Diagnosis not present

## 2020-12-15 DIAGNOSIS — I4821 Permanent atrial fibrillation: Secondary | ICD-10-CM | POA: Diagnosis not present

## 2020-12-15 DIAGNOSIS — G3184 Mild cognitive impairment, so stated: Secondary | ICD-10-CM | POA: Diagnosis not present

## 2020-12-16 DIAGNOSIS — I071 Rheumatic tricuspid insufficiency: Secondary | ICD-10-CM | POA: Diagnosis not present

## 2020-12-16 DIAGNOSIS — I4891 Unspecified atrial fibrillation: Secondary | ICD-10-CM | POA: Diagnosis not present

## 2020-12-17 DIAGNOSIS — R0602 Shortness of breath: Secondary | ICD-10-CM | POA: Diagnosis not present

## 2020-12-17 DIAGNOSIS — I480 Paroxysmal atrial fibrillation: Secondary | ICD-10-CM | POA: Diagnosis not present

## 2020-12-19 DIAGNOSIS — I10 Essential (primary) hypertension: Secondary | ICD-10-CM | POA: Diagnosis not present

## 2020-12-19 DIAGNOSIS — I4891 Unspecified atrial fibrillation: Secondary | ICD-10-CM | POA: Diagnosis not present

## 2020-12-19 DIAGNOSIS — E119 Type 2 diabetes mellitus without complications: Secondary | ICD-10-CM | POA: Diagnosis not present

## 2020-12-19 DIAGNOSIS — E785 Hyperlipidemia, unspecified: Secondary | ICD-10-CM | POA: Diagnosis not present

## 2020-12-20 ENCOUNTER — Ambulatory Visit: Payer: Medicare PPO | Admitting: Cardiology

## 2020-12-22 DIAGNOSIS — R0602 Shortness of breath: Secondary | ICD-10-CM | POA: Diagnosis not present

## 2020-12-22 DIAGNOSIS — E785 Hyperlipidemia, unspecified: Secondary | ICD-10-CM | POA: Diagnosis not present

## 2020-12-22 DIAGNOSIS — E119 Type 2 diabetes mellitus without complications: Secondary | ICD-10-CM | POA: Diagnosis not present

## 2020-12-22 DIAGNOSIS — I480 Paroxysmal atrial fibrillation: Secondary | ICD-10-CM | POA: Diagnosis not present

## 2020-12-22 DIAGNOSIS — Z79899 Other long term (current) drug therapy: Secondary | ICD-10-CM | POA: Diagnosis not present

## 2020-12-22 DIAGNOSIS — Z7984 Long term (current) use of oral hypoglycemic drugs: Secondary | ICD-10-CM | POA: Diagnosis not present

## 2020-12-22 DIAGNOSIS — Z7901 Long term (current) use of anticoagulants: Secondary | ICD-10-CM | POA: Diagnosis not present

## 2020-12-22 DIAGNOSIS — I4891 Unspecified atrial fibrillation: Secondary | ICD-10-CM | POA: Diagnosis not present

## 2020-12-22 DIAGNOSIS — I10 Essential (primary) hypertension: Secondary | ICD-10-CM | POA: Diagnosis not present

## 2020-12-23 DIAGNOSIS — E119 Type 2 diabetes mellitus without complications: Secondary | ICD-10-CM | POA: Diagnosis not present

## 2020-12-23 DIAGNOSIS — E785 Hyperlipidemia, unspecified: Secondary | ICD-10-CM | POA: Diagnosis not present

## 2020-12-23 DIAGNOSIS — I4891 Unspecified atrial fibrillation: Secondary | ICD-10-CM | POA: Diagnosis not present

## 2020-12-23 DIAGNOSIS — I10 Essential (primary) hypertension: Secondary | ICD-10-CM | POA: Diagnosis not present

## 2021-03-10 DIAGNOSIS — E785 Hyperlipidemia, unspecified: Secondary | ICD-10-CM | POA: Diagnosis not present

## 2021-03-10 DIAGNOSIS — I482 Chronic atrial fibrillation, unspecified: Secondary | ICD-10-CM | POA: Diagnosis not present

## 2021-03-10 DIAGNOSIS — Z79899 Other long term (current) drug therapy: Secondary | ICD-10-CM | POA: Diagnosis not present

## 2021-03-10 DIAGNOSIS — E1159 Type 2 diabetes mellitus with other circulatory complications: Secondary | ICD-10-CM | POA: Diagnosis not present

## 2021-03-10 DIAGNOSIS — M1990 Unspecified osteoarthritis, unspecified site: Secondary | ICD-10-CM | POA: Diagnosis not present

## 2021-03-17 DIAGNOSIS — I4821 Permanent atrial fibrillation: Secondary | ICD-10-CM | POA: Diagnosis not present

## 2021-03-17 DIAGNOSIS — R7309 Other abnormal glucose: Secondary | ICD-10-CM | POA: Diagnosis not present

## 2021-03-17 DIAGNOSIS — E785 Hyperlipidemia, unspecified: Secondary | ICD-10-CM | POA: Diagnosis not present

## 2021-03-17 DIAGNOSIS — E1122 Type 2 diabetes mellitus with diabetic chronic kidney disease: Secondary | ICD-10-CM | POA: Diagnosis not present

## 2021-03-17 DIAGNOSIS — D696 Thrombocytopenia, unspecified: Secondary | ICD-10-CM | POA: Diagnosis not present

## 2021-05-01 DIAGNOSIS — L4059 Other psoriatic arthropathy: Secondary | ICD-10-CM | POA: Diagnosis not present

## 2021-05-01 DIAGNOSIS — L400 Psoriasis vulgaris: Secondary | ICD-10-CM | POA: Diagnosis not present

## 2021-05-01 DIAGNOSIS — Z79899 Other long term (current) drug therapy: Secondary | ICD-10-CM | POA: Diagnosis not present

## 2021-05-02 DIAGNOSIS — Z79899 Other long term (current) drug therapy: Secondary | ICD-10-CM | POA: Diagnosis not present

## 2021-05-02 DIAGNOSIS — L400 Psoriasis vulgaris: Secondary | ICD-10-CM | POA: Diagnosis not present

## 2021-05-02 DIAGNOSIS — L4059 Other psoriatic arthropathy: Secondary | ICD-10-CM | POA: Diagnosis not present

## 2021-05-17 DIAGNOSIS — M130 Polyarthritis, unspecified: Secondary | ICD-10-CM | POA: Diagnosis not present

## 2021-05-17 DIAGNOSIS — M25561 Pain in right knee: Secondary | ICD-10-CM | POA: Diagnosis not present

## 2021-05-17 DIAGNOSIS — M79641 Pain in right hand: Secondary | ICD-10-CM | POA: Diagnosis not present

## 2021-05-17 DIAGNOSIS — M25562 Pain in left knee: Secondary | ICD-10-CM | POA: Diagnosis not present

## 2021-05-17 DIAGNOSIS — E119 Type 2 diabetes mellitus without complications: Secondary | ICD-10-CM | POA: Diagnosis not present

## 2021-05-17 DIAGNOSIS — M7551 Bursitis of right shoulder: Secondary | ICD-10-CM | POA: Diagnosis not present

## 2021-05-17 DIAGNOSIS — M545 Low back pain, unspecified: Secondary | ICD-10-CM | POA: Diagnosis not present

## 2021-05-17 DIAGNOSIS — M19012 Primary osteoarthritis, left shoulder: Secondary | ICD-10-CM | POA: Diagnosis not present

## 2021-05-17 DIAGNOSIS — M79642 Pain in left hand: Secondary | ICD-10-CM | POA: Diagnosis not present

## 2021-05-18 DIAGNOSIS — Z79899 Other long term (current) drug therapy: Secondary | ICD-10-CM | POA: Diagnosis not present

## 2021-05-18 DIAGNOSIS — E559 Vitamin D deficiency, unspecified: Secondary | ICD-10-CM | POA: Diagnosis not present

## 2021-05-18 DIAGNOSIS — M130 Polyarthritis, unspecified: Secondary | ICD-10-CM | POA: Diagnosis not present

## 2021-05-18 DIAGNOSIS — R5383 Other fatigue: Secondary | ICD-10-CM | POA: Diagnosis not present

## 2021-05-25 DIAGNOSIS — I10 Essential (primary) hypertension: Secondary | ICD-10-CM | POA: Diagnosis not present

## 2021-05-25 DIAGNOSIS — E119 Type 2 diabetes mellitus without complications: Secondary | ICD-10-CM | POA: Diagnosis not present

## 2021-05-25 DIAGNOSIS — M0579 Rheumatoid arthritis with rheumatoid factor of multiple sites without organ or systems involvement: Secondary | ICD-10-CM | POA: Diagnosis not present

## 2021-05-25 DIAGNOSIS — L409 Psoriasis, unspecified: Secondary | ICD-10-CM | POA: Diagnosis not present

## 2021-05-25 DIAGNOSIS — E785 Hyperlipidemia, unspecified: Secondary | ICD-10-CM | POA: Diagnosis not present

## 2021-05-25 DIAGNOSIS — R768 Other specified abnormal immunological findings in serum: Secondary | ICD-10-CM | POA: Diagnosis not present

## 2021-05-25 DIAGNOSIS — E559 Vitamin D deficiency, unspecified: Secondary | ICD-10-CM | POA: Diagnosis not present

## 2021-05-25 DIAGNOSIS — I4891 Unspecified atrial fibrillation: Secondary | ICD-10-CM | POA: Diagnosis not present

## 2021-06-12 DIAGNOSIS — G3184 Mild cognitive impairment, so stated: Secondary | ICD-10-CM | POA: Diagnosis not present

## 2021-06-12 DIAGNOSIS — I4811 Longstanding persistent atrial fibrillation: Secondary | ICD-10-CM | POA: Diagnosis not present

## 2021-06-12 DIAGNOSIS — D696 Thrombocytopenia, unspecified: Secondary | ICD-10-CM | POA: Diagnosis not present

## 2021-06-12 DIAGNOSIS — E785 Hyperlipidemia, unspecified: Secondary | ICD-10-CM | POA: Diagnosis not present

## 2021-06-12 DIAGNOSIS — E1129 Type 2 diabetes mellitus with other diabetic kidney complication: Secondary | ICD-10-CM | POA: Diagnosis not present

## 2021-06-12 DIAGNOSIS — Z79899 Other long term (current) drug therapy: Secondary | ICD-10-CM | POA: Diagnosis not present

## 2021-06-16 DIAGNOSIS — E559 Vitamin D deficiency, unspecified: Secondary | ICD-10-CM | POA: Diagnosis not present

## 2021-06-16 DIAGNOSIS — Z79899 Other long term (current) drug therapy: Secondary | ICD-10-CM | POA: Diagnosis not present

## 2021-06-16 DIAGNOSIS — M0579 Rheumatoid arthritis with rheumatoid factor of multiple sites without organ or systems involvement: Secondary | ICD-10-CM | POA: Diagnosis not present

## 2021-06-19 DIAGNOSIS — G3184 Mild cognitive impairment, so stated: Secondary | ICD-10-CM | POA: Diagnosis not present

## 2021-06-19 DIAGNOSIS — N1831 Chronic kidney disease, stage 3a: Secondary | ICD-10-CM | POA: Diagnosis not present

## 2021-06-19 DIAGNOSIS — R7309 Other abnormal glucose: Secondary | ICD-10-CM | POA: Diagnosis not present

## 2021-06-19 DIAGNOSIS — E785 Hyperlipidemia, unspecified: Secondary | ICD-10-CM | POA: Diagnosis not present

## 2021-06-19 DIAGNOSIS — E1122 Type 2 diabetes mellitus with diabetic chronic kidney disease: Secondary | ICD-10-CM | POA: Diagnosis not present

## 2021-06-22 DIAGNOSIS — M0579 Rheumatoid arthritis with rheumatoid factor of multiple sites without organ or systems involvement: Secondary | ICD-10-CM | POA: Diagnosis not present

## 2021-06-22 DIAGNOSIS — L409 Psoriasis, unspecified: Secondary | ICD-10-CM | POA: Diagnosis not present

## 2021-06-22 DIAGNOSIS — E119 Type 2 diabetes mellitus without complications: Secondary | ICD-10-CM | POA: Diagnosis not present

## 2021-06-22 DIAGNOSIS — M79672 Pain in left foot: Secondary | ICD-10-CM | POA: Diagnosis not present

## 2021-06-22 DIAGNOSIS — R768 Other specified abnormal immunological findings in serum: Secondary | ICD-10-CM | POA: Diagnosis not present

## 2021-06-22 DIAGNOSIS — M79671 Pain in right foot: Secondary | ICD-10-CM | POA: Diagnosis not present

## 2021-06-22 DIAGNOSIS — I10 Essential (primary) hypertension: Secondary | ICD-10-CM | POA: Diagnosis not present

## 2021-06-22 DIAGNOSIS — R7989 Other specified abnormal findings of blood chemistry: Secondary | ICD-10-CM | POA: Diagnosis not present

## 2021-06-22 DIAGNOSIS — E559 Vitamin D deficiency, unspecified: Secondary | ICD-10-CM | POA: Diagnosis not present

## 2021-06-24 IMAGING — MR MR HEAD WO/W CM
12 of 14 series · 36 of 48 positions shown · IV contrast (gadavist)
Comparison: None.

CLINICAL DATA: Weakness and forgetfulness

EXAM:
MRI HEAD WITHOUT AND WITH CONTRAST
TECHNIQUE: Multiplanar, multiecho pulse sequences of the brain and surrounding
structures were obtained without and with intravenous contrast.
CONTRAST:  10mL GADAVIST GADOBUTROL 1 MMOL/ML IV SOLN

[Series 5: DWI · axial · 4.0mm · 0.88mm/px · z∈[-4,+137]mm · 3 of 38 slices shown (1 of 6)]
[im 1/38]
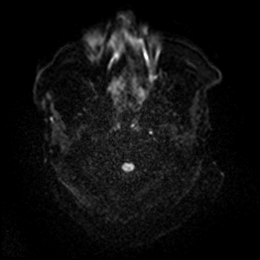
[im 19/38]
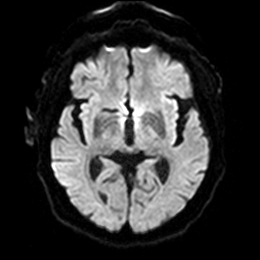
[im 38/38]
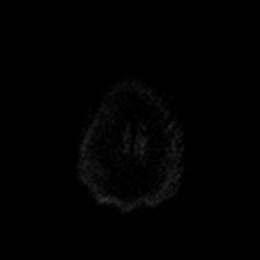

[Series 5: DWI · axial · 4.0mm · 0.88mm/px · z∈[-4,+137]mm · 4 of 38 slices shown (2 of 6)]
[im 1/38]
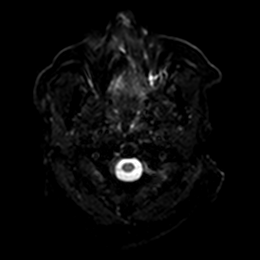
[im 13/38]
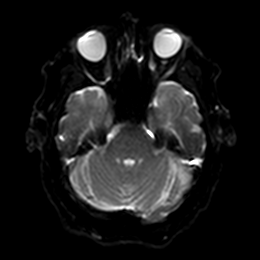
[im 25/38]
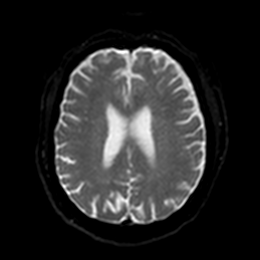
[im 38/38]
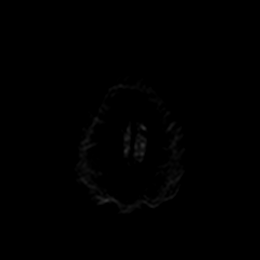

[Series 6: DWI · axial · 4.0mm · 0.88mm/px · z∈[-4,+137]mm · 4 of 38 slices shown (3 of 6)]
[im 1/38]
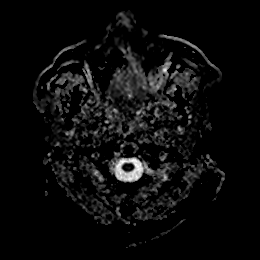
[im 13/38]
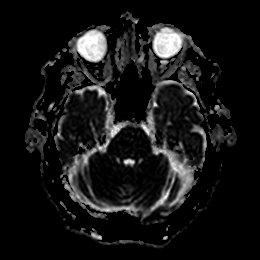
[im 25/38]
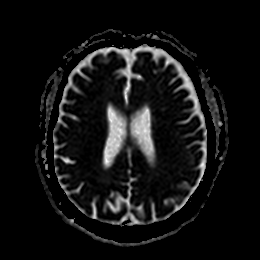
[im 38/38]
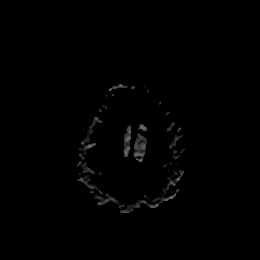

[Series 7: DWI · coronal · 5.0mm · 0.88mm/px · 3 of 28 slices shown (4 of 6)]
[im 1/28]
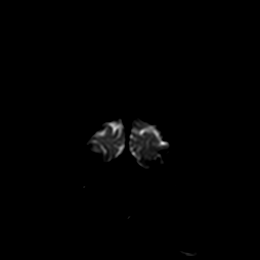
[im 14/28]
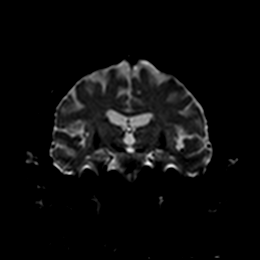
[im 28/28]
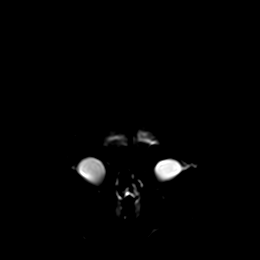

[Series 7: DWI · coronal · 5.0mm · 0.88mm/px · 3 of 28 slices shown (5 of 6)]
[im 1/28]
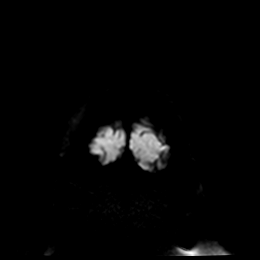
[im 14/28]
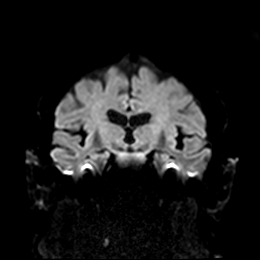
[im 28/28]
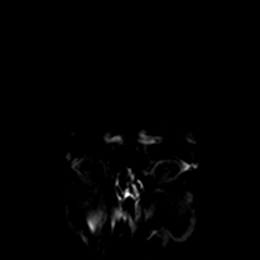

[Series 8: DWI · coronal · 5.0mm · 0.88mm/px · 3 of 28 slices shown (6 of 6)]
[im 1/28]
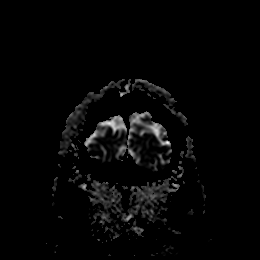
[im 14/28]
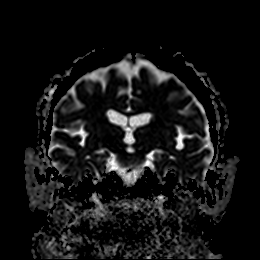
[im 28/28]
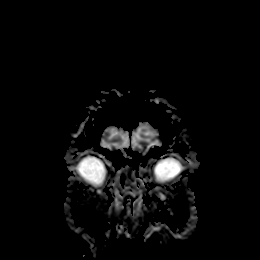

[Series 9: T1 · sagittal · 5.0mm · 0.94mm/px · 2 of 21 slices shown]
[im 1/21]
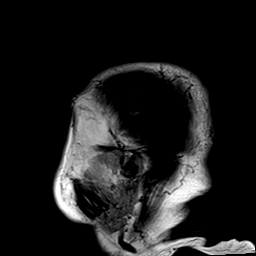
[im 21/21]
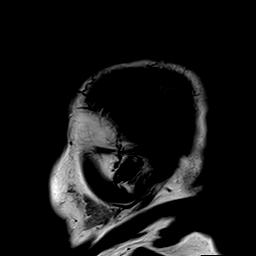

[Series 10: T2 · axial · 5.0mm · 0.72mm/px · z∈[+3,+130]mm · 2 of 20 slices shown (1 of 2)]
[im 1/20]
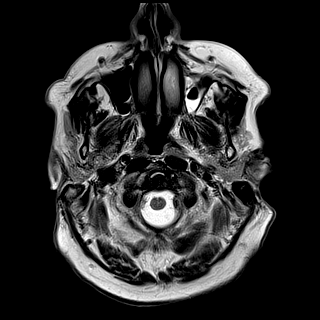
[im 20/20]
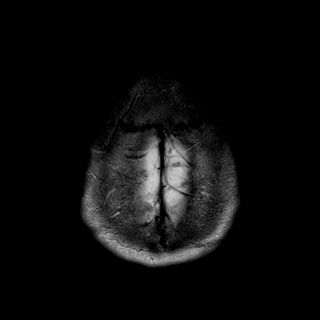

[Series 11: ax hemo · axial · 5.0mm · 0.86mm/px · z∈[-3,+135]mm · 3 of 25 slices shown]
[im 1/25]
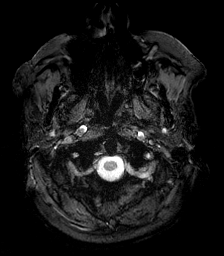
[im 13/25]
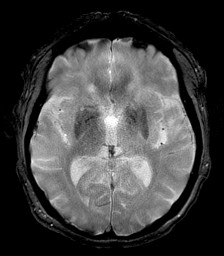
[im 25/25]
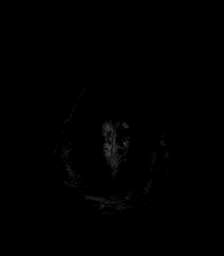

[Series 12: FLAIR · axial · 4.0mm · 0.43mm/px · z∈[+6,+125]mm · 3 of 32 slices shown]
[im 1/32]
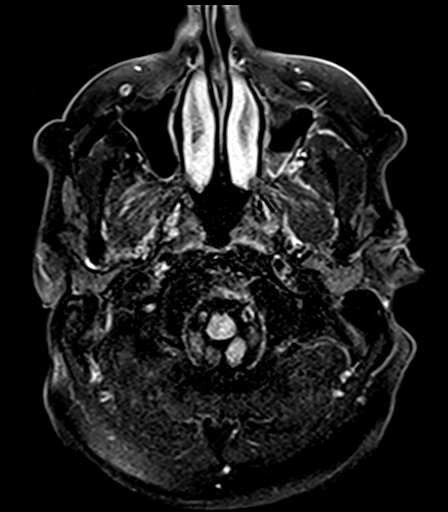
[im 16/32]
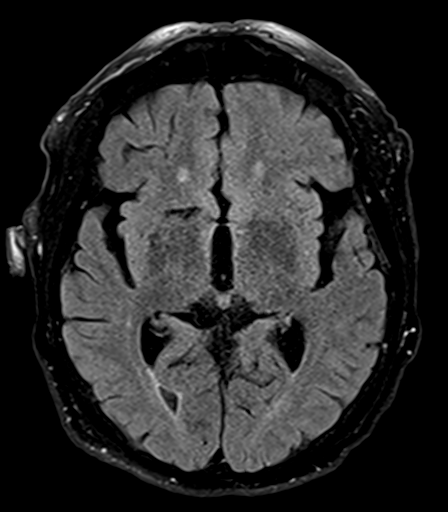
[im 32/32]
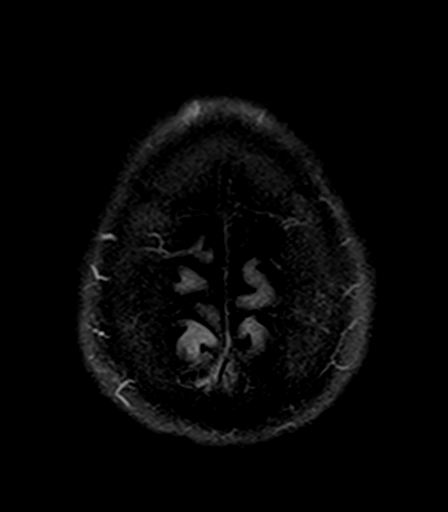

[Series 14: T2 · coronal · 5.0mm · 0.72mm/px · 3 of 26 slices shown (2 of 2)]
[im 1/26]
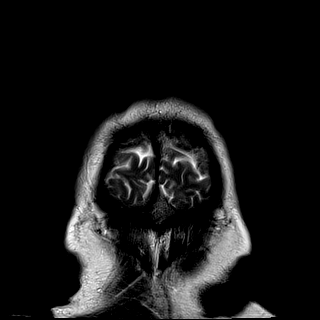
[im 13/26]
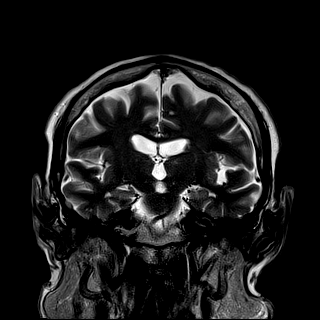
[im 26/26]
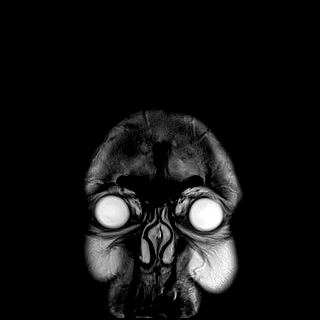

[Series 16: T1 post-contrast · coronal · 5.0mm · 0.34mm/px · 3 of 29 slices shown]
[im 1/29]
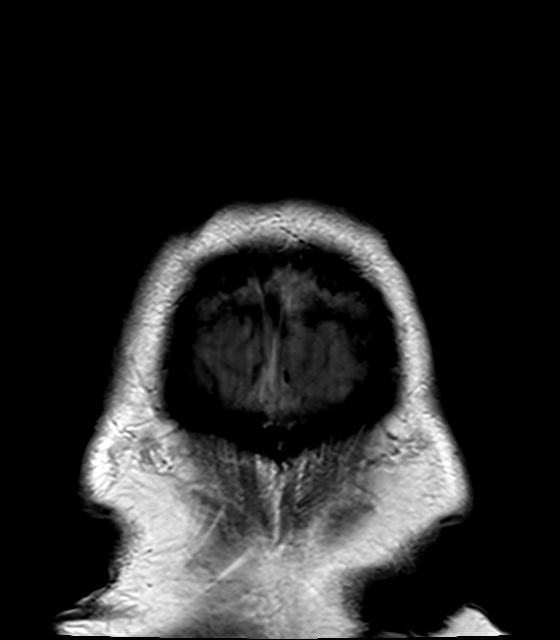
[im 15/29]
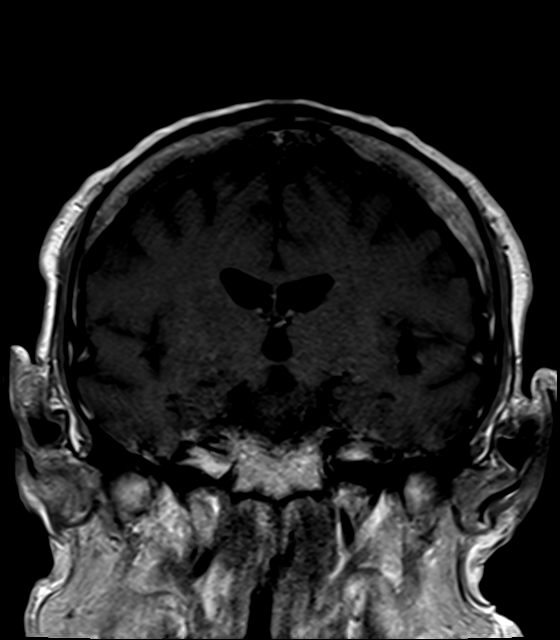
[im 29/29]
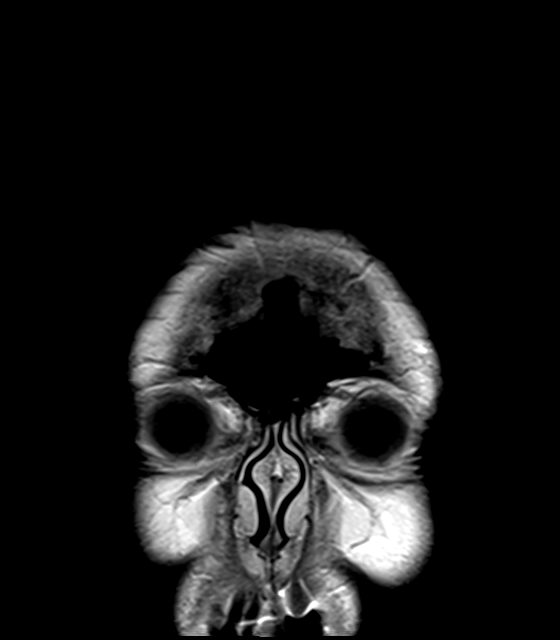

[36 of 48 positions shown; findings below may reference images not displayed]

FINDINGS: Brain: There is no acute infarction or intracranial hemorrhage.
There is no intracranial mass, mass effect, or edema. There is no
hydrocephalus or extra-axial fluid collection. Prominent
perivascular space or chronic small vessel infarct of the inferior
right basal ganglia. Minimal patchy T2 hyperintense foci in the
supratentorial white matter are nonspecific but may reflect minor
chronic microvascular ischemic changes. Prominence of the ventricles
and sulci reflects minor generalized parenchymal volume loss. No
abnormal enhancement.

Vascular: Major vessel flow voids at the skull base are preserved.

Skull and upper cervical spine: Normal marrow signal is preserved.

Sinuses/Orbits: Mild mucosal thickening.  Orbits are unremarkable.

Other: Sella is unremarkable.  Mastoid air cells are clear.
IMPRESSION: No evidence of recent infarction, hemorrhage, or mass. No abnormal
enhancement.

Minor chronic microvascular ischemic changes.

## 2021-07-12 DIAGNOSIS — E785 Hyperlipidemia, unspecified: Secondary | ICD-10-CM | POA: Diagnosis not present

## 2021-07-12 DIAGNOSIS — I4891 Unspecified atrial fibrillation: Secondary | ICD-10-CM | POA: Diagnosis not present

## 2021-07-12 DIAGNOSIS — I10 Essential (primary) hypertension: Secondary | ICD-10-CM | POA: Diagnosis not present

## 2021-07-12 DIAGNOSIS — E119 Type 2 diabetes mellitus without complications: Secondary | ICD-10-CM | POA: Diagnosis not present

## 2021-07-12 DIAGNOSIS — Z136 Encounter for screening for cardiovascular disorders: Secondary | ICD-10-CM | POA: Diagnosis not present

## 2021-09-12 DIAGNOSIS — Z79899 Other long term (current) drug therapy: Secondary | ICD-10-CM | POA: Diagnosis not present

## 2021-09-12 DIAGNOSIS — M0579 Rheumatoid arthritis with rheumatoid factor of multiple sites without organ or systems involvement: Secondary | ICD-10-CM | POA: Diagnosis not present

## 2021-09-19 DIAGNOSIS — E785 Hyperlipidemia, unspecified: Secondary | ICD-10-CM | POA: Diagnosis not present

## 2021-09-19 DIAGNOSIS — Z79899 Other long term (current) drug therapy: Secondary | ICD-10-CM | POA: Diagnosis not present

## 2021-09-19 DIAGNOSIS — E1129 Type 2 diabetes mellitus with other diabetic kidney complication: Secondary | ICD-10-CM | POA: Diagnosis not present

## 2021-09-19 DIAGNOSIS — L4052 Psoriatic arthritis mutilans: Secondary | ICD-10-CM | POA: Diagnosis not present

## 2021-09-19 DIAGNOSIS — G3184 Mild cognitive impairment, so stated: Secondary | ICD-10-CM | POA: Diagnosis not present

## 2021-09-21 DIAGNOSIS — I10 Essential (primary) hypertension: Secondary | ICD-10-CM | POA: Diagnosis not present

## 2021-09-21 DIAGNOSIS — E785 Hyperlipidemia, unspecified: Secondary | ICD-10-CM | POA: Diagnosis not present

## 2021-09-21 DIAGNOSIS — R7989 Other specified abnormal findings of blood chemistry: Secondary | ICD-10-CM | POA: Diagnosis not present

## 2021-09-21 DIAGNOSIS — E559 Vitamin D deficiency, unspecified: Secondary | ICD-10-CM | POA: Diagnosis not present

## 2021-09-21 DIAGNOSIS — R768 Other specified abnormal immunological findings in serum: Secondary | ICD-10-CM | POA: Diagnosis not present

## 2021-09-21 DIAGNOSIS — I4891 Unspecified atrial fibrillation: Secondary | ICD-10-CM | POA: Diagnosis not present

## 2021-09-21 DIAGNOSIS — M0579 Rheumatoid arthritis with rheumatoid factor of multiple sites without organ or systems involvement: Secondary | ICD-10-CM | POA: Diagnosis not present

## 2021-09-21 DIAGNOSIS — L409 Psoriasis, unspecified: Secondary | ICD-10-CM | POA: Diagnosis not present

## 2021-09-21 DIAGNOSIS — E119 Type 2 diabetes mellitus without complications: Secondary | ICD-10-CM | POA: Diagnosis not present

## 2021-09-25 DIAGNOSIS — M2041 Other hammer toe(s) (acquired), right foot: Secondary | ICD-10-CM | POA: Diagnosis not present

## 2021-09-25 DIAGNOSIS — M2012 Hallux valgus (acquired), left foot: Secondary | ICD-10-CM | POA: Diagnosis not present

## 2021-09-25 DIAGNOSIS — M2040 Other hammer toe(s) (acquired), unspecified foot: Secondary | ICD-10-CM | POA: Diagnosis not present

## 2021-09-25 DIAGNOSIS — M2010 Hallux valgus (acquired), unspecified foot: Secondary | ICD-10-CM | POA: Diagnosis not present

## 2021-09-25 DIAGNOSIS — B351 Tinea unguium: Secondary | ICD-10-CM | POA: Diagnosis not present

## 2021-09-25 DIAGNOSIS — M2042 Other hammer toe(s) (acquired), left foot: Secondary | ICD-10-CM | POA: Diagnosis not present

## 2021-09-25 DIAGNOSIS — M2011 Hallux valgus (acquired), right foot: Secondary | ICD-10-CM | POA: Diagnosis not present

## 2021-09-25 DIAGNOSIS — L409 Psoriasis, unspecified: Secondary | ICD-10-CM | POA: Diagnosis not present

## 2021-09-25 DIAGNOSIS — E119 Type 2 diabetes mellitus without complications: Secondary | ICD-10-CM | POA: Diagnosis not present

## 2021-09-26 DIAGNOSIS — Z23 Encounter for immunization: Secondary | ICD-10-CM | POA: Diagnosis not present

## 2021-09-26 DIAGNOSIS — R7309 Other abnormal glucose: Secondary | ICD-10-CM | POA: Diagnosis not present

## 2021-09-26 DIAGNOSIS — I4811 Longstanding persistent atrial fibrillation: Secondary | ICD-10-CM | POA: Diagnosis not present

## 2021-09-26 DIAGNOSIS — E1122 Type 2 diabetes mellitus with diabetic chronic kidney disease: Secondary | ICD-10-CM | POA: Diagnosis not present

## 2021-09-26 DIAGNOSIS — N1831 Chronic kidney disease, stage 3a: Secondary | ICD-10-CM | POA: Diagnosis not present

## 2021-11-27 DIAGNOSIS — M129 Arthropathy, unspecified: Secondary | ICD-10-CM | POA: Diagnosis not present

## 2021-11-27 DIAGNOSIS — Z79899 Other long term (current) drug therapy: Secondary | ICD-10-CM | POA: Diagnosis not present

## 2021-11-27 DIAGNOSIS — L400 Psoriasis vulgaris: Secondary | ICD-10-CM | POA: Diagnosis not present

## 2021-12-06 DIAGNOSIS — M0579 Rheumatoid arthritis with rheumatoid factor of multiple sites without organ or systems involvement: Secondary | ICD-10-CM | POA: Diagnosis not present

## 2021-12-06 DIAGNOSIS — Z79899 Other long term (current) drug therapy: Secondary | ICD-10-CM | POA: Diagnosis not present

## 2021-12-13 DIAGNOSIS — Z13820 Encounter for screening for osteoporosis: Secondary | ICD-10-CM | POA: Diagnosis not present

## 2021-12-13 DIAGNOSIS — E119 Type 2 diabetes mellitus without complications: Secondary | ICD-10-CM | POA: Diagnosis not present

## 2021-12-13 DIAGNOSIS — M0579 Rheumatoid arthritis with rheumatoid factor of multiple sites without organ or systems involvement: Secondary | ICD-10-CM | POA: Diagnosis not present

## 2021-12-13 DIAGNOSIS — I4891 Unspecified atrial fibrillation: Secondary | ICD-10-CM | POA: Diagnosis not present

## 2021-12-13 DIAGNOSIS — R7989 Other specified abnormal findings of blood chemistry: Secondary | ICD-10-CM | POA: Diagnosis not present

## 2021-12-13 DIAGNOSIS — R768 Other specified abnormal immunological findings in serum: Secondary | ICD-10-CM | POA: Diagnosis not present

## 2021-12-13 DIAGNOSIS — E559 Vitamin D deficiency, unspecified: Secondary | ICD-10-CM | POA: Diagnosis not present

## 2021-12-13 DIAGNOSIS — L409 Psoriasis, unspecified: Secondary | ICD-10-CM | POA: Diagnosis not present

## 2021-12-13 DIAGNOSIS — I10 Essential (primary) hypertension: Secondary | ICD-10-CM | POA: Diagnosis not present

## 2021-12-13 DIAGNOSIS — E785 Hyperlipidemia, unspecified: Secondary | ICD-10-CM | POA: Diagnosis not present

## 2021-12-18 DIAGNOSIS — I4819 Other persistent atrial fibrillation: Secondary | ICD-10-CM | POA: Diagnosis not present

## 2021-12-18 DIAGNOSIS — G308 Other Alzheimer's disease: Secondary | ICD-10-CM | POA: Diagnosis not present

## 2021-12-18 DIAGNOSIS — N1831 Chronic kidney disease, stage 3a: Secondary | ICD-10-CM | POA: Diagnosis not present

## 2021-12-18 DIAGNOSIS — Z79899 Other long term (current) drug therapy: Secondary | ICD-10-CM | POA: Diagnosis not present

## 2021-12-18 DIAGNOSIS — I10 Essential (primary) hypertension: Secondary | ICD-10-CM | POA: Diagnosis not present

## 2021-12-18 DIAGNOSIS — E1129 Type 2 diabetes mellitus with other diabetic kidney complication: Secondary | ICD-10-CM | POA: Diagnosis not present

## 2021-12-25 DIAGNOSIS — M2010 Hallux valgus (acquired), unspecified foot: Secondary | ICD-10-CM | POA: Diagnosis not present

## 2021-12-25 DIAGNOSIS — L409 Psoriasis, unspecified: Secondary | ICD-10-CM | POA: Diagnosis not present

## 2021-12-25 DIAGNOSIS — E119 Type 2 diabetes mellitus without complications: Secondary | ICD-10-CM | POA: Diagnosis not present

## 2021-12-25 DIAGNOSIS — B351 Tinea unguium: Secondary | ICD-10-CM | POA: Diagnosis not present

## 2021-12-26 DIAGNOSIS — I10 Essential (primary) hypertension: Secondary | ICD-10-CM | POA: Diagnosis not present

## 2021-12-26 DIAGNOSIS — N1831 Chronic kidney disease, stage 3a: Secondary | ICD-10-CM | POA: Diagnosis not present

## 2021-12-26 DIAGNOSIS — R7309 Other abnormal glucose: Secondary | ICD-10-CM | POA: Diagnosis not present

## 2021-12-26 DIAGNOSIS — E1122 Type 2 diabetes mellitus with diabetic chronic kidney disease: Secondary | ICD-10-CM | POA: Diagnosis not present

## 2022-01-11 DIAGNOSIS — E785 Hyperlipidemia, unspecified: Secondary | ICD-10-CM | POA: Diagnosis not present

## 2022-01-11 DIAGNOSIS — I4891 Unspecified atrial fibrillation: Secondary | ICD-10-CM | POA: Diagnosis not present

## 2022-01-11 DIAGNOSIS — I10 Essential (primary) hypertension: Secondary | ICD-10-CM | POA: Diagnosis not present

## 2022-01-11 DIAGNOSIS — Z136 Encounter for screening for cardiovascular disorders: Secondary | ICD-10-CM | POA: Diagnosis not present

## 2022-01-11 DIAGNOSIS — E119 Type 2 diabetes mellitus without complications: Secondary | ICD-10-CM | POA: Diagnosis not present

## 2022-02-26 DIAGNOSIS — J22 Unspecified acute lower respiratory infection: Secondary | ICD-10-CM | POA: Diagnosis not present

## 2022-02-26 DIAGNOSIS — Z79899 Other long term (current) drug therapy: Secondary | ICD-10-CM | POA: Diagnosis not present

## 2022-02-26 DIAGNOSIS — M0579 Rheumatoid arthritis with rheumatoid factor of multiple sites without organ or systems involvement: Secondary | ICD-10-CM | POA: Diagnosis not present

## 2022-03-27 DIAGNOSIS — B353 Tinea pedis: Secondary | ICD-10-CM | POA: Diagnosis not present

## 2022-03-27 DIAGNOSIS — L409 Psoriasis, unspecified: Secondary | ICD-10-CM | POA: Diagnosis not present

## 2022-03-27 DIAGNOSIS — B351 Tinea unguium: Secondary | ICD-10-CM | POA: Diagnosis not present

## 2022-03-28 DIAGNOSIS — I10 Essential (primary) hypertension: Secondary | ICD-10-CM | POA: Diagnosis not present

## 2022-03-28 DIAGNOSIS — I4819 Other persistent atrial fibrillation: Secondary | ICD-10-CM | POA: Diagnosis not present

## 2022-03-28 DIAGNOSIS — Z79899 Other long term (current) drug therapy: Secondary | ICD-10-CM | POA: Diagnosis not present

## 2022-03-28 DIAGNOSIS — E1129 Type 2 diabetes mellitus with other diabetic kidney complication: Secondary | ICD-10-CM | POA: Diagnosis not present

## 2022-03-28 DIAGNOSIS — N1831 Chronic kidney disease, stage 3a: Secondary | ICD-10-CM | POA: Diagnosis not present

## 2022-03-28 DIAGNOSIS — D6869 Other thrombophilia: Secondary | ICD-10-CM | POA: Diagnosis not present

## 2022-04-04 DIAGNOSIS — R7309 Other abnormal glucose: Secondary | ICD-10-CM | POA: Diagnosis not present

## 2022-04-04 DIAGNOSIS — N1831 Chronic kidney disease, stage 3a: Secondary | ICD-10-CM | POA: Diagnosis not present

## 2022-04-04 DIAGNOSIS — E1122 Type 2 diabetes mellitus with diabetic chronic kidney disease: Secondary | ICD-10-CM | POA: Diagnosis not present

## 2022-04-04 DIAGNOSIS — E785 Hyperlipidemia, unspecified: Secondary | ICD-10-CM | POA: Diagnosis not present

## 2022-04-23 DIAGNOSIS — L400 Psoriasis vulgaris: Secondary | ICD-10-CM | POA: Diagnosis not present

## 2022-04-23 DIAGNOSIS — M129 Arthropathy, unspecified: Secondary | ICD-10-CM | POA: Diagnosis not present

## 2022-04-23 DIAGNOSIS — Z79899 Other long term (current) drug therapy: Secondary | ICD-10-CM | POA: Diagnosis not present

## 2022-04-23 DIAGNOSIS — B353 Tinea pedis: Secondary | ICD-10-CM | POA: Diagnosis not present

## 2022-06-26 DIAGNOSIS — I10 Essential (primary) hypertension: Secondary | ICD-10-CM | POA: Diagnosis not present

## 2022-06-26 DIAGNOSIS — Z136 Encounter for screening for cardiovascular disorders: Secondary | ICD-10-CM | POA: Diagnosis not present

## 2022-06-26 DIAGNOSIS — I4891 Unspecified atrial fibrillation: Secondary | ICD-10-CM | POA: Diagnosis not present

## 2022-06-26 DIAGNOSIS — E119 Type 2 diabetes mellitus without complications: Secondary | ICD-10-CM | POA: Diagnosis not present

## 2022-06-26 DIAGNOSIS — E785 Hyperlipidemia, unspecified: Secondary | ICD-10-CM | POA: Diagnosis not present

## 2022-06-27 DIAGNOSIS — L600 Ingrowing nail: Secondary | ICD-10-CM | POA: Diagnosis not present

## 2022-06-27 DIAGNOSIS — M79673 Pain in unspecified foot: Secondary | ICD-10-CM | POA: Diagnosis not present

## 2022-06-27 DIAGNOSIS — M2010 Hallux valgus (acquired), unspecified foot: Secondary | ICD-10-CM | POA: Diagnosis not present

## 2022-06-27 DIAGNOSIS — M2040 Other hammer toe(s) (acquired), unspecified foot: Secondary | ICD-10-CM | POA: Diagnosis not present

## 2022-06-27 DIAGNOSIS — L409 Psoriasis, unspecified: Secondary | ICD-10-CM | POA: Diagnosis not present

## 2022-06-27 DIAGNOSIS — E119 Type 2 diabetes mellitus without complications: Secondary | ICD-10-CM | POA: Diagnosis not present

## 2022-06-28 DIAGNOSIS — E785 Hyperlipidemia, unspecified: Secondary | ICD-10-CM | POA: Diagnosis not present

## 2022-06-28 DIAGNOSIS — N1831 Chronic kidney disease, stage 3a: Secondary | ICD-10-CM | POA: Diagnosis not present

## 2022-06-28 DIAGNOSIS — G309 Alzheimer's disease, unspecified: Secondary | ICD-10-CM | POA: Diagnosis not present

## 2022-06-28 DIAGNOSIS — E1129 Type 2 diabetes mellitus with other diabetic kidney complication: Secondary | ICD-10-CM | POA: Diagnosis not present

## 2022-07-06 DIAGNOSIS — Z23 Encounter for immunization: Secondary | ICD-10-CM | POA: Diagnosis not present

## 2022-07-06 DIAGNOSIS — G309 Alzheimer's disease, unspecified: Secondary | ICD-10-CM | POA: Diagnosis not present

## 2022-07-06 DIAGNOSIS — E1122 Type 2 diabetes mellitus with diabetic chronic kidney disease: Secondary | ICD-10-CM | POA: Diagnosis not present

## 2022-07-06 DIAGNOSIS — I480 Paroxysmal atrial fibrillation: Secondary | ICD-10-CM | POA: Diagnosis not present

## 2022-07-06 DIAGNOSIS — R7309 Other abnormal glucose: Secondary | ICD-10-CM | POA: Diagnosis not present

## 2022-08-07 DIAGNOSIS — Z79899 Other long term (current) drug therapy: Secondary | ICD-10-CM | POA: Diagnosis not present

## 2022-10-10 DIAGNOSIS — Z79899 Other long term (current) drug therapy: Secondary | ICD-10-CM | POA: Diagnosis not present

## 2022-10-10 DIAGNOSIS — G309 Alzheimer's disease, unspecified: Secondary | ICD-10-CM | POA: Diagnosis not present

## 2022-10-10 DIAGNOSIS — E1129 Type 2 diabetes mellitus with other diabetic kidney complication: Secondary | ICD-10-CM | POA: Diagnosis not present

## 2022-10-10 DIAGNOSIS — N1831 Chronic kidney disease, stage 3a: Secondary | ICD-10-CM | POA: Diagnosis not present

## 2022-10-23 DIAGNOSIS — E1122 Type 2 diabetes mellitus with diabetic chronic kidney disease: Secondary | ICD-10-CM | POA: Diagnosis not present

## 2022-10-23 DIAGNOSIS — I480 Paroxysmal atrial fibrillation: Secondary | ICD-10-CM | POA: Diagnosis not present

## 2022-10-23 DIAGNOSIS — D6869 Other thrombophilia: Secondary | ICD-10-CM | POA: Diagnosis not present

## 2022-10-23 DIAGNOSIS — I10 Essential (primary) hypertension: Secondary | ICD-10-CM | POA: Diagnosis not present

## 2023-01-18 DIAGNOSIS — E1129 Type 2 diabetes mellitus with other diabetic kidney complication: Secondary | ICD-10-CM | POA: Diagnosis not present

## 2023-01-23 DIAGNOSIS — R7309 Other abnormal glucose: Secondary | ICD-10-CM | POA: Diagnosis not present

## 2023-01-23 DIAGNOSIS — E1122 Type 2 diabetes mellitus with diabetic chronic kidney disease: Secondary | ICD-10-CM | POA: Diagnosis not present

## 2023-01-23 DIAGNOSIS — I480 Paroxysmal atrial fibrillation: Secondary | ICD-10-CM | POA: Diagnosis not present

## 2023-01-23 DIAGNOSIS — I10 Essential (primary) hypertension: Secondary | ICD-10-CM | POA: Diagnosis not present

## 2023-01-30 DIAGNOSIS — I4891 Unspecified atrial fibrillation: Secondary | ICD-10-CM | POA: Diagnosis not present

## 2023-01-30 DIAGNOSIS — E785 Hyperlipidemia, unspecified: Secondary | ICD-10-CM | POA: Diagnosis not present

## 2023-01-30 DIAGNOSIS — I10 Essential (primary) hypertension: Secondary | ICD-10-CM | POA: Diagnosis not present

## 2023-01-30 DIAGNOSIS — Z136 Encounter for screening for cardiovascular disorders: Secondary | ICD-10-CM | POA: Diagnosis not present

## 2023-01-30 DIAGNOSIS — E119 Type 2 diabetes mellitus without complications: Secondary | ICD-10-CM | POA: Diagnosis not present

## 2023-04-11 DIAGNOSIS — G309 Alzheimer's disease, unspecified: Secondary | ICD-10-CM | POA: Diagnosis not present

## 2023-04-11 DIAGNOSIS — I700 Atherosclerosis of aorta: Secondary | ICD-10-CM | POA: Diagnosis not present

## 2023-04-11 DIAGNOSIS — E1129 Type 2 diabetes mellitus with other diabetic kidney complication: Secondary | ICD-10-CM | POA: Diagnosis not present

## 2023-04-11 DIAGNOSIS — Z79899 Other long term (current) drug therapy: Secondary | ICD-10-CM | POA: Diagnosis not present

## 2023-04-11 DIAGNOSIS — I10 Essential (primary) hypertension: Secondary | ICD-10-CM | POA: Diagnosis not present

## 2023-04-23 DIAGNOSIS — E1122 Type 2 diabetes mellitus with diabetic chronic kidney disease: Secondary | ICD-10-CM | POA: Diagnosis not present

## 2023-04-23 DIAGNOSIS — I10 Essential (primary) hypertension: Secondary | ICD-10-CM | POA: Diagnosis not present

## 2023-04-23 DIAGNOSIS — I480 Paroxysmal atrial fibrillation: Secondary | ICD-10-CM | POA: Diagnosis not present

## 2023-04-23 DIAGNOSIS — E785 Hyperlipidemia, unspecified: Secondary | ICD-10-CM | POA: Diagnosis not present

## 2023-08-30 DIAGNOSIS — E1129 Type 2 diabetes mellitus with other diabetic kidney complication: Secondary | ICD-10-CM | POA: Diagnosis not present

## 2023-11-28 DIAGNOSIS — I10 Essential (primary) hypertension: Secondary | ICD-10-CM | POA: Diagnosis not present

## 2023-11-28 DIAGNOSIS — G309 Alzheimer's disease, unspecified: Secondary | ICD-10-CM | POA: Diagnosis not present

## 2023-11-28 DIAGNOSIS — E1122 Type 2 diabetes mellitus with diabetic chronic kidney disease: Secondary | ICD-10-CM | POA: Diagnosis not present

## 2023-12-02 DIAGNOSIS — I480 Paroxysmal atrial fibrillation: Secondary | ICD-10-CM | POA: Diagnosis not present

## 2023-12-02 DIAGNOSIS — E1122 Type 2 diabetes mellitus with diabetic chronic kidney disease: Secondary | ICD-10-CM | POA: Diagnosis not present

## 2023-12-02 DIAGNOSIS — I10 Essential (primary) hypertension: Secondary | ICD-10-CM | POA: Diagnosis not present

## 2024-03-22 DEATH — deceased

## 2024-05-14 ENCOUNTER — Telehealth: Payer: Self-pay

## 2024-05-14 NOTE — Telephone Encounter (Signed)
 Patient deceased, note added to chart
